# Patient Record
Sex: Female | Born: 1980 | Race: Black or African American | Hispanic: No | Marital: Single | Smoking: Never smoker
Health system: Southern US, Community
[De-identification: ages and names within clinical notes are randomized; demographics above are authoritative.]

## PROBLEM LIST (undated history)

## (undated) DIAGNOSIS — I4891 Unspecified atrial fibrillation: Secondary | ICD-10-CM

## (undated) HISTORY — PX: OTHER SURGICAL HISTORY: SHX169

---

## 2013-02-06 DIAGNOSIS — I4891 Unspecified atrial fibrillation: Secondary | ICD-10-CM | POA: Insufficient documentation

## 2013-11-09 HISTORY — PX: OTHER SURGICAL HISTORY: SHX169

## 2020-04-18 ENCOUNTER — Emergency Department (HOSPITAL_COMMUNITY)
Admission: EM | Admit: 2020-04-18 | Discharge: 2020-04-18 | Disposition: A | Discharge status: Home / Self Care | Payer: Medicaid Other | Attending: Emergency Medicine | Admitting: Emergency Medicine

## 2020-04-18 ENCOUNTER — Encounter (HOSPITAL_COMMUNITY): Payer: Self-pay | Admitting: Emergency Medicine

## 2020-04-18 ENCOUNTER — Other Ambulatory Visit: Payer: Self-pay

## 2020-04-18 DIAGNOSIS — Z5321 Procedure and treatment not carried out due to patient leaving prior to being seen by health care provider: Secondary | ICD-10-CM | POA: Diagnosis not present

## 2020-04-18 DIAGNOSIS — N200 Calculus of kidney: Secondary | ICD-10-CM | POA: Insufficient documentation

## 2020-04-18 HISTORY — DX: Unspecified atrial fibrillation: I48.91

## 2020-04-18 NOTE — ED Triage Notes (Signed)
Pt reports having numbness in right arm and leg that started appx 1 hr ago. Pt denies any other neurological symptoms. Pt has full range of motion with upper and lower extremities

## 2021-03-17 DIAGNOSIS — L02422 Furuncle of left axilla: Secondary | ICD-10-CM | POA: Insufficient documentation

## 2021-05-08 ENCOUNTER — Encounter: Payer: Self-pay | Admitting: Neurology

## 2021-05-08 ENCOUNTER — Emergency Department (HOSPITAL_COMMUNITY)
Admission: EM | Admit: 2021-05-08 | Discharge: 2021-05-08 | Disposition: A | Discharge status: Home / Self Care | Payer: Medicaid Other | Attending: Emergency Medicine | Admitting: Emergency Medicine

## 2021-05-08 ENCOUNTER — Emergency Department (HOSPITAL_COMMUNITY): Payer: Medicaid Other

## 2021-05-08 ENCOUNTER — Other Ambulatory Visit: Payer: Self-pay

## 2021-05-08 DIAGNOSIS — R200 Anesthesia of skin: Secondary | ICD-10-CM | POA: Diagnosis present

## 2021-05-08 DIAGNOSIS — R202 Paresthesia of skin: Secondary | ICD-10-CM | POA: Insufficient documentation

## 2021-05-08 LAB — URINALYSIS, ROUTINE W REFLEX MICROSCOPIC
Bilirubin Urine: NEGATIVE
Glucose, UA: NEGATIVE mg/dL
Hgb urine dipstick: NEGATIVE
Ketones, ur: NEGATIVE mg/dL
Nitrite: NEGATIVE
Protein, ur: NEGATIVE mg/dL
Specific Gravity, Urine: 1.013 (ref 1.005–1.030)
pH: 6 (ref 5.0–8.0)

## 2021-05-08 LAB — CBC
HCT: 34.9 % — ABNORMAL LOW (ref 36.0–46.0)
Hemoglobin: 10.9 g/dL — ABNORMAL LOW (ref 12.0–15.0)
MCH: 24.5 pg — ABNORMAL LOW (ref 26.0–34.0)
MCHC: 31.2 g/dL (ref 30.0–36.0)
MCV: 78.6 fL — ABNORMAL LOW (ref 80.0–100.0)
Platelets: 263 10*3/uL (ref 150–400)
RBC: 4.44 MIL/uL (ref 3.87–5.11)
RDW: 14.7 % (ref 11.5–15.5)
WBC: 5.6 10*3/uL (ref 4.0–10.5)
nRBC: 0 % (ref 0.0–0.2)

## 2021-05-08 LAB — BASIC METABOLIC PANEL
Anion gap: 7 (ref 5–15)
BUN: 10 mg/dL (ref 6–20)
CO2: 23 mmol/L (ref 22–32)
Calcium: 9.2 mg/dL (ref 8.9–10.3)
Chloride: 106 mmol/L (ref 98–111)
Creatinine, Ser: 0.57 mg/dL (ref 0.44–1.00)
GFR, Estimated: >60 mL/min (ref >60)
Glucose, Bld: 104 mg/dL — ABNORMAL HIGH (ref 70–99)
Potassium: 3.9 mmol/L (ref 3.5–5.1)
Sodium: 136 mmol/L (ref 135–145)

## 2021-05-08 LAB — I-STAT BETA HCG BLOOD, ED (MC, WL, AP ONLY): I-stat hCG, quantitative: <5 m[IU]/mL (ref <5)

## 2021-05-08 IMAGING — MR MR CERVICAL SPINE WO/W CM
35 of 38 series · 43 of 48 positions shown · IV contrast (gadavist)
Comparison: None.

CLINICAL DATA: Right leg numbness and tingling. Rule out
demyelinating disease.

EXAM:
MRI CERVICAL SPINE WITHOUT AND WITH CONTRAST
TECHNIQUE: Multiplanar and multiecho pulse sequences of the cervical spine, to
include the craniocervical junction and cervicothoracic junction,
were obtained without and with intravenous contrast.
CONTRAST:  9mL GADAVIST GADOBUTROL 1 MMOL/ML IV SOLN

[Series 5: DWI · axial · 3.0mm · 1.36mm/px · z∈[-122,+31]mm · 2 of 104 slices shown (1 of 2)]
[im 1/104]
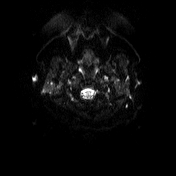
[im 104/104]
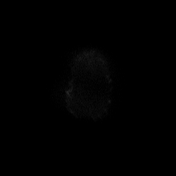

[Series 6: DWI · axial · 3.0mm · 1.36mm/px · 1 of 52 slices shown (2 of 2)]
[im 1/52]
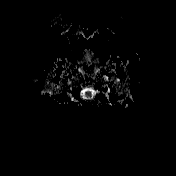

[Series 7: T1 · sagittal · 5.0mm · 0.75mm/px · 1 of 22 slices shown (1 of 9)]
[im 1/22]
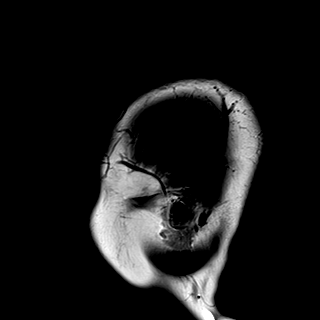

[Series 8: T2 · axial · 5.0mm · 0.62mm/px · 1 of 26 slices shown (1 of 8)]
[im 1/26]
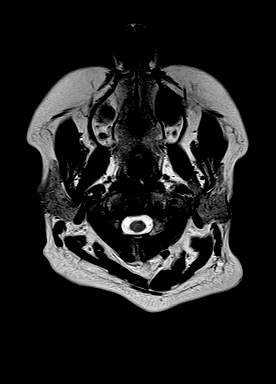

[Series 9: swi_images · axial · 3.0mm · 0.75mm/px · 1 of 56 slices shown]
[im 1/56]
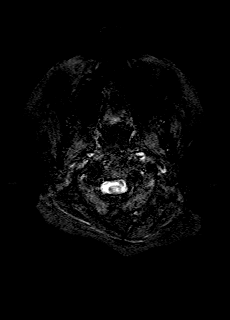

[Series 11: FLAIR · axial · 3.0mm · 0.75mm/px · 1 of 52 slices shown]
[im 1/52]
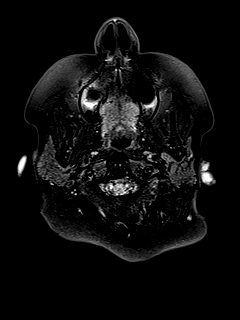

[Series 12: T1 · axial · 1.0mm · 0.94mm/px · z∈[-118,+41]mm · 4 of 160 slices shown (2 of 9)]
[im 1/160]
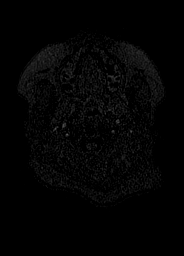
[im 54/160]
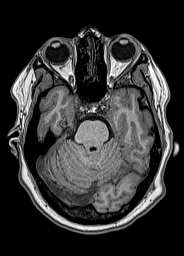
[im 107/160]
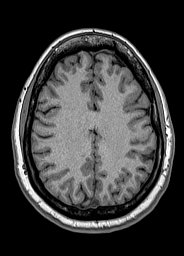
[im 160/160]
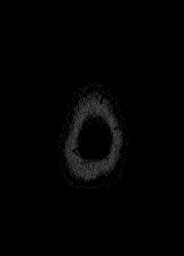

[Series 13: cor dwi_tracew · coronal · 5.0mm · 1.53mm/px · 1 of 56 slices shown]
[im 1/56]
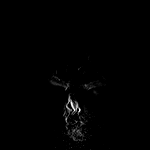

[Series 14: cor dwi_adc · coronal · 5.0mm · 1.53mm/px · 1 of 28 slices shown]
[im 1/28]
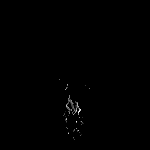

[Series 16: T2 · coronal · 5.0mm · 0.57mm/px · 1 of 35 slices shown (2 of 8)]
[im 1/35]
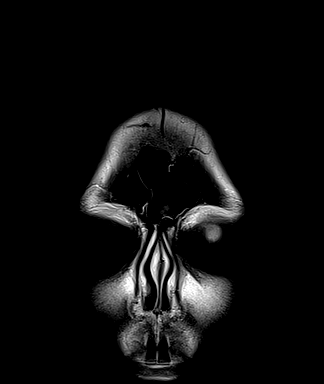

[Series 21: T1 · sagittal · 3.0mm · 0.69mm/px · 1 of 15 slices shown (3 of 9)]
[im 1/15]
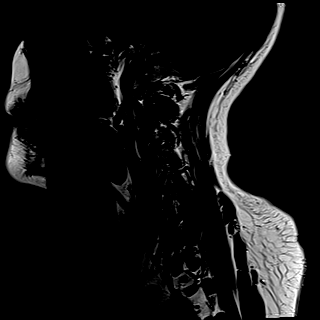

[Series 22: T2 · sagittal · 3.0mm · 0.69mm/px · 1 of 15 slices shown (3 of 8)]
[im 1/15]
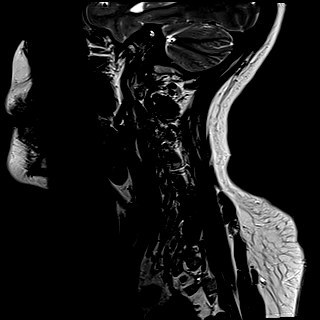

[Series 23: STIR · sagittal · 3.0mm · 0.86mm/px · 1 of 15 slices shown (1 of 3)]
[im 1/15]
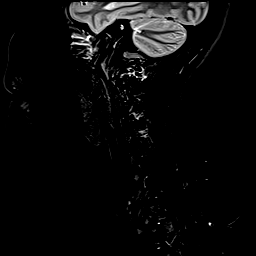

[Series 24: T2 · axial · 3.0mm · 0.70mm/px · 1 of 30 slices shown (4 of 8)]
[im 1/30]
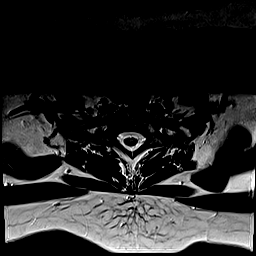

[Series 26: T1 · axial · 3.0mm · 0.35mm/px · 1 of 30 slices shown (4 of 9)]
[im 1/30]
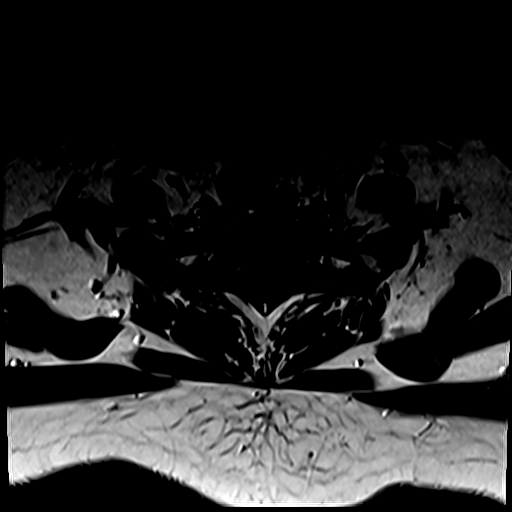

[Series 42: T1 · sagittal · 4.0mm · 1.72mm/px · 1 of 5 slices shown (5 of 9)]
[im 1/5]
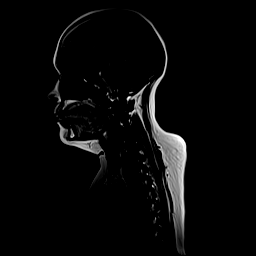

[Series 43: STIR · sagittal · 3.0mm · 1.03mm/px · 1 of 15 slices shown (2 of 3)]
[im 1/15]
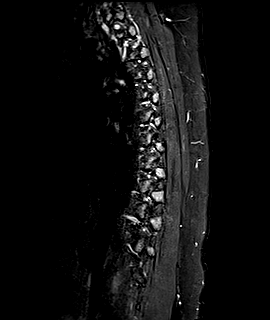

[Series 44: T1 · sagittal · 3.0mm · 1.00mm/px · 1 of 15 slices shown (6 of 9)]
[im 1/15]
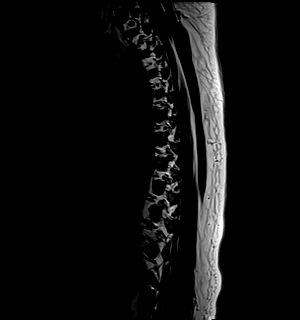

[Series 45: T2 · sagittal · 3.0mm · 0.86mm/px · 1 of 15 slices shown (5 of 8)]
[im 1/15]
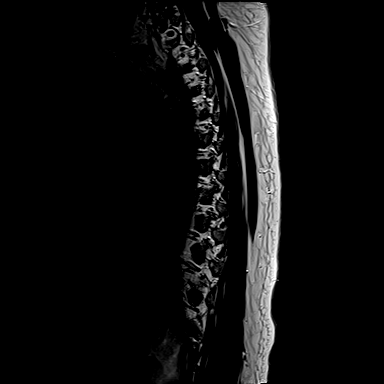

[Series 46: T2 · axial · 4.0mm · 0.78mm/px · 1 of 39 slices shown (6 of 8)]
[im 1/39]
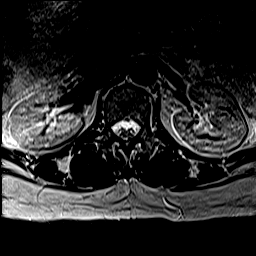

[Series 48: T1 · axial · 4.0mm · 0.39mm/px · 1 of 39 slices shown (7 of 9)]
[im 1/39]
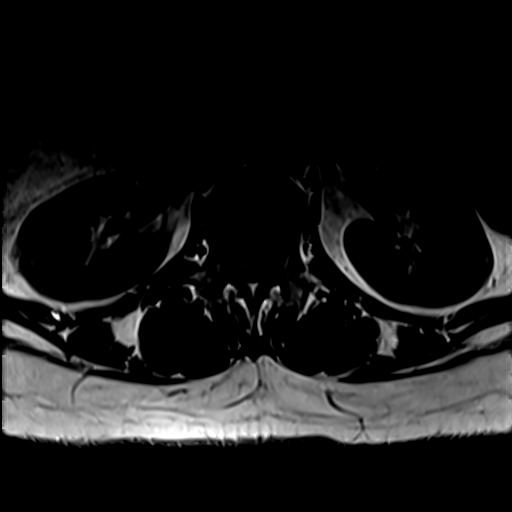

[Series 54: T2 · sagittal · 4.0mm · 0.81mm/px · 1 of 14 slices shown (7 of 8)]
[im 1/14]
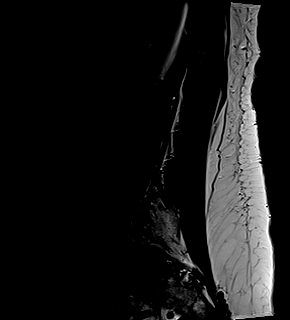

[Series 55: T1 · sagittal · 4.0mm · 0.81mm/px · 1 of 14 slices shown (8 of 9)]
[im 1/14]
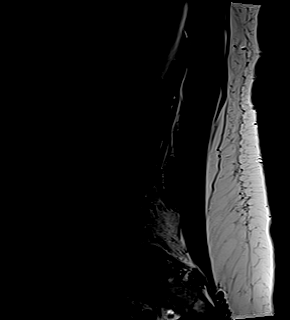

[Series 56: STIR · sagittal · 4.0mm · 0.51mm/px · 1 of 14 slices shown (3 of 3)]
[im 1/14]
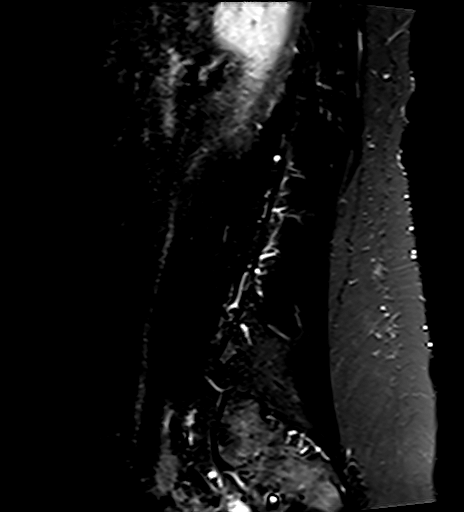

[Series 57: T2 · axial · 4.0mm · 0.62mm/px · 1 of 39 slices shown (8 of 8)]
[im 1/39]
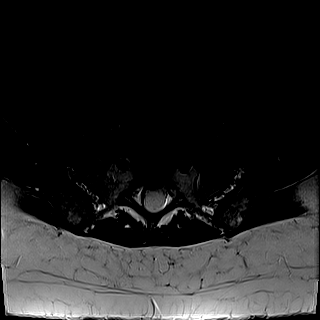

[Series 58: T1 · axial · 4.0mm · 0.39mm/px · 1 of 39 slices shown (9 of 9)]
[im 1/39]
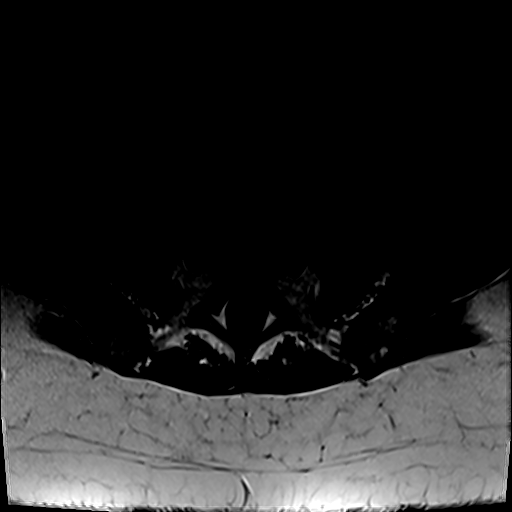

[Series 59: T1 fat-sat post-contrast · sagittal · 4.0mm · 0.81mm/px · 1 of 14 slices shown (1 of 3)]
[im 1/14]
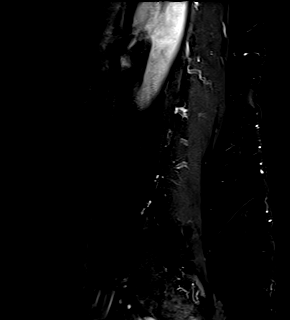

[Series 60: T1 post-contrast · axial · 4.0mm · 0.43mm/px · 1 of 39 slices shown (1 of 6)]
[im 1/39]
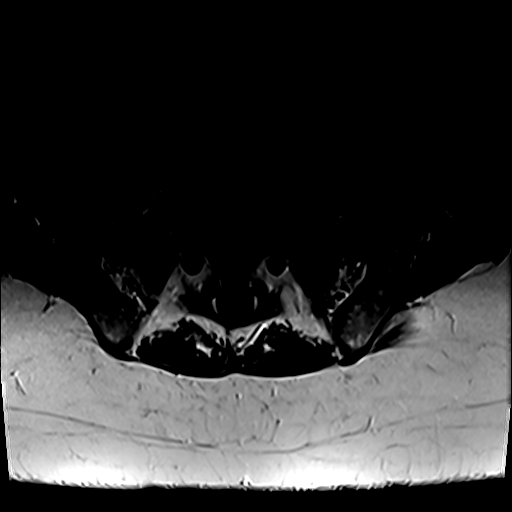

[Series 61: T1 fat-sat post-contrast · sagittal · 3.0mm · 1.03mm/px · 1 of 15 slices shown (2 of 3)]
[im 1/15]
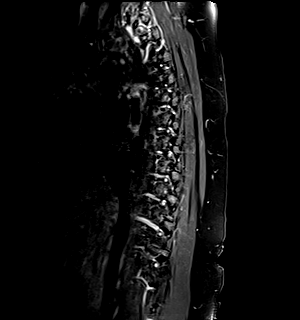

[Series 62: T1 post-contrast · axial · 4.0mm · 0.39mm/px · 1 of 39 slices shown (2 of 6)]
[im 1/39]
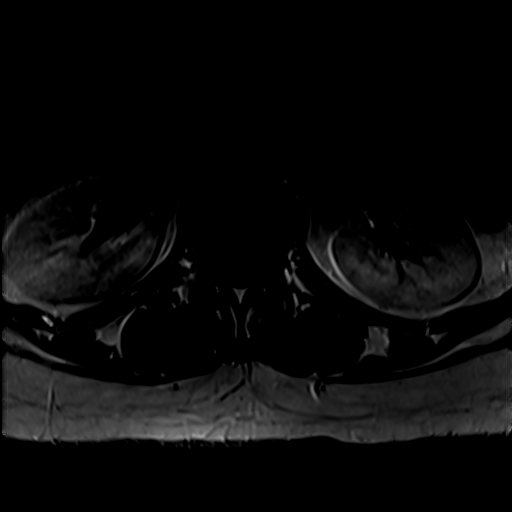

[Series 63: T1 fat-sat post-contrast · sagittal · 3.0mm · 0.69mm/px · 1 of 15 slices shown (3 of 3)]
[im 1/15]
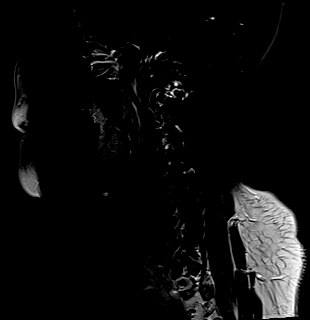

[Series 64: T1 post-contrast · axial · 3.0mm · 0.35mm/px · 1 of 30 slices shown (3 of 6)]
[im 1/30]
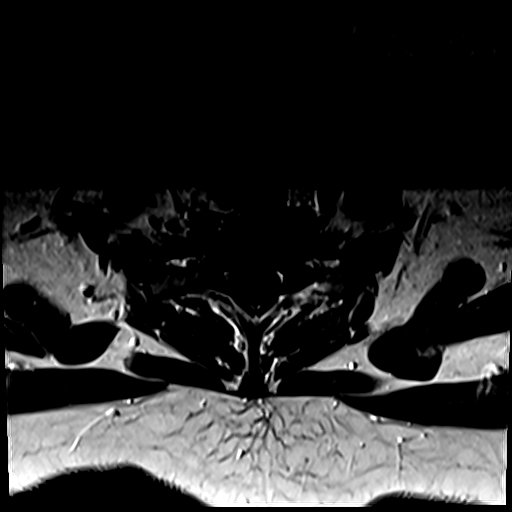

[Series 69: T1 post-contrast · axial · 1.0mm · 0.94mm/px · z∈[-105,+38]mm · 5 of 144 slices shown (4 of 6)]
[im 1/144]
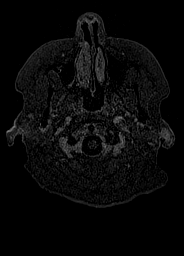
[im 36/144]
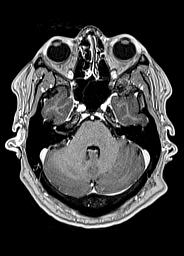
[im 72/144]
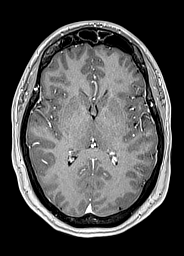
[im 108/144]
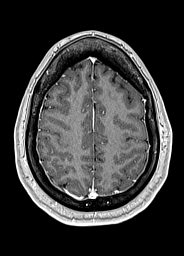
[im 144/144]
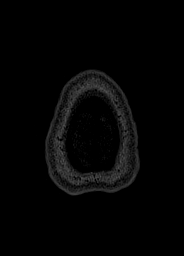

[Series 70: T1 post-contrast · coronal · 5.0mm · 0.43mm/px · 1 of 26 slices shown (5 of 6)]
[im 1/26]
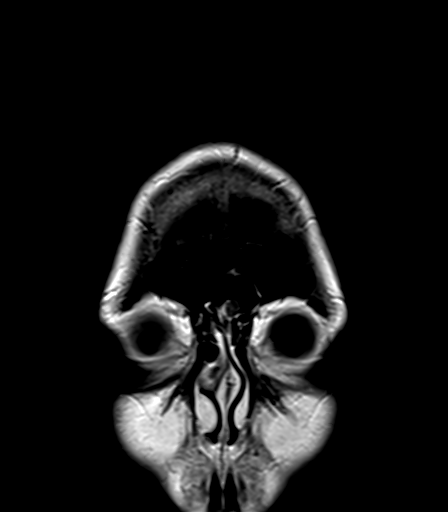

[Series 71: T1 post-contrast · sagittal · 5.0mm · 0.75mm/px · 1 of 24 slices shown (6 of 6)]
[im 1/24]
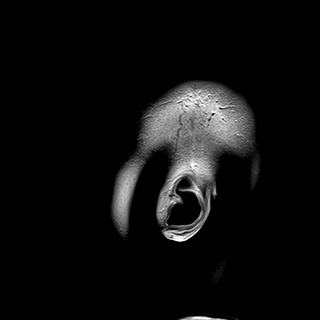

[43 of 48 positions shown; findings below may reference images not displayed]

FINDINGS: Alignment: Normal alignment.  Mild cervical kyphosis

Vertebrae: Negative for fracture or mass

Cord: No focal cord lesion identified. No abnormal enhancement of
the cord.

Posterior Fossa, vertebral arteries, paraspinal tissues: Negative

Disc levels:

C2-3: Negative

C3-4: Negative

C4-5: Small central disc protrusion with cord flattening and mild
spinal stenosis. Neural foramina patent bilaterally

C5-6: Disc degeneration and mild diffuse uncinate spurring. Cord
flattening with mild spinal stenosis and mild foraminal narrowing
bilaterally

C6-7: Disc degeneration with mild diffuse uncinate spurring. No
significant spinal or foraminal stenosis

C7-T1: Negative
IMPRESSION: No focal cord lesion identified

Cervical spondylosis. Mild spinal stenosis C5-6 with mild foraminal
narrowing bilaterally.

## 2021-05-08 IMAGING — MR MR HEAD WO/W CM
29 of 38 series · 39 of 48 positions shown · IV contrast (gadavist)
Comparison: None.

CLINICAL DATA: Right leg numbness and tingling. Rule out
demyelinating disease.

EXAM:
MRI HEAD WITHOUT AND WITH CONTRAST
TECHNIQUE: Multiplanar, multiecho pulse sequences of the brain and surrounding
structures were obtained without and with intravenous contrast.
CONTRAST:  9mL GADAVIST GADOBUTROL 1 MMOL/ML IV SOLN

[Series 5: DWI · axial · 3.0mm · 1.36mm/px · z∈[-122,+31]mm · 2 of 104 slices shown (1 of 2)]
[im 1/104]
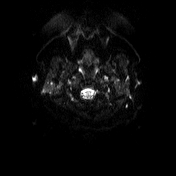
[im 104/104]
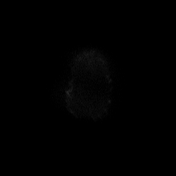

[Series 6: DWI · axial · 3.0mm · 1.36mm/px · 1 of 52 slices shown (2 of 2)]
[im 1/52]
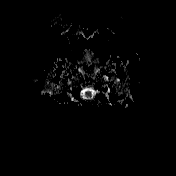

[Series 7: T1 · sagittal · 5.0mm · 0.75mm/px · 1 of 22 slices shown (1 of 9)]
[im 1/22]
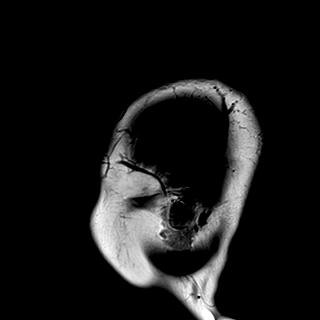

[Series 8: T2 · axial · 5.0mm · 0.62mm/px · 1 of 26 slices shown (1 of 8)]
[im 1/26]
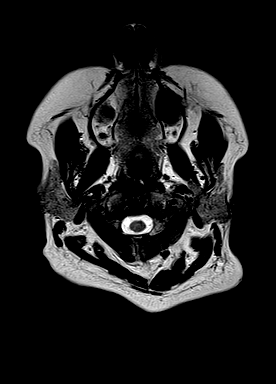

[Series 11: FLAIR · axial · 3.0mm · 0.75mm/px · 1 of 52 slices shown (1 of 2)]
[im 1/52]
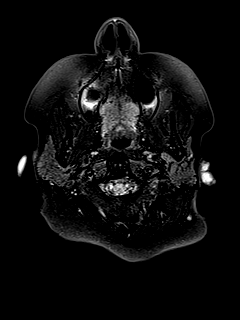

[Series 12: T1 · axial · 1.0mm · 0.94mm/px · z∈[-118,+41]mm · 4 of 160 slices shown (2 of 9)]
[im 1/160]
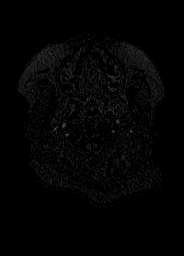
[im 54/160]
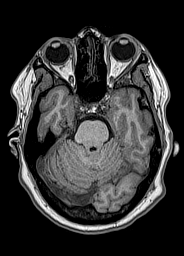
[im 107/160]
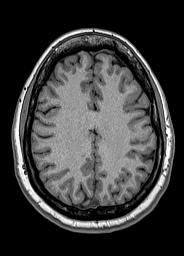
[im 160/160]
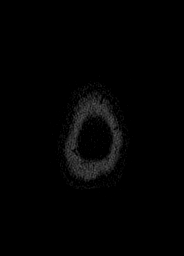

[Series 15: FLAIR · sagittal · 1.1mm · 0.50mm/px · 3 of 120 slices shown (2 of 2)]
[im 1/120]
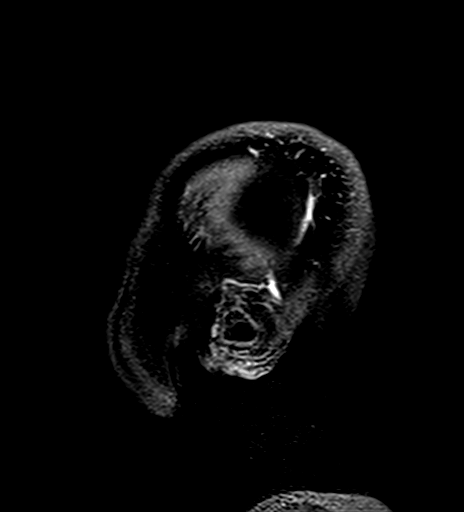
[im 60/120]
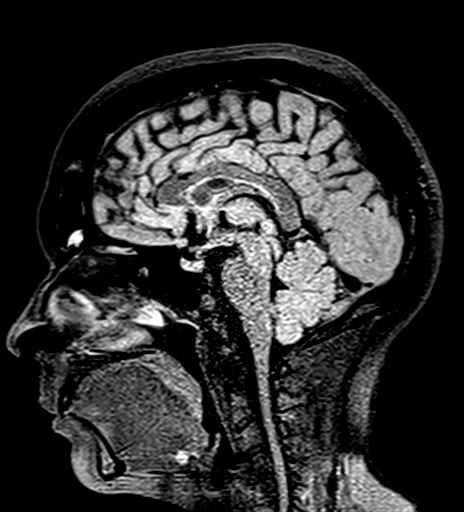
[im 120/120]
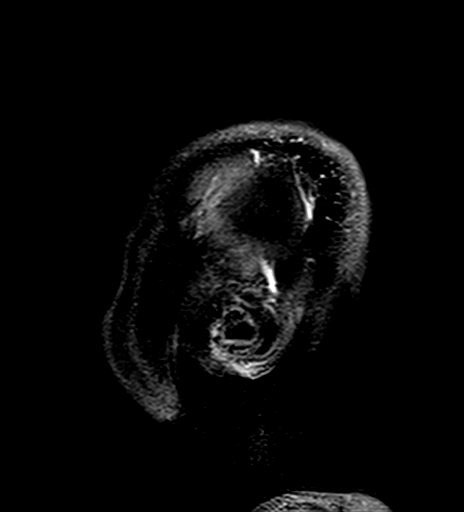

[Series 16: T2 · coronal · 5.0mm · 0.57mm/px · 1 of 35 slices shown (2 of 8)]
[im 1/35]
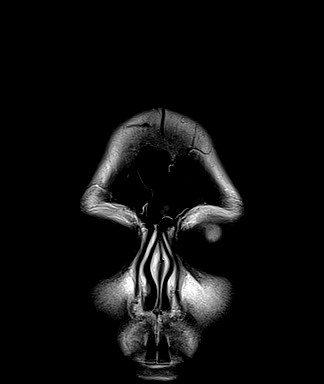

[Series 21: T1 · sagittal · 3.0mm · 0.69mm/px · 1 of 15 slices shown (3 of 9)]
[im 1/15]
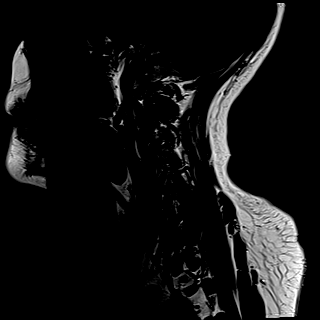

[Series 22: T2 · sagittal · 3.0mm · 0.69mm/px · 1 of 15 slices shown (3 of 8)]
[im 1/15]
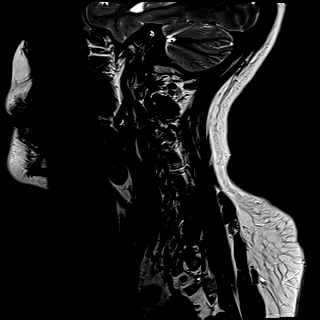

[Series 24: T2 · axial · 3.0mm · 0.70mm/px · 1 of 30 slices shown (4 of 8)]
[im 1/30]
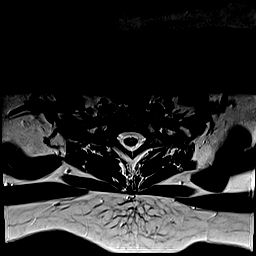

[Series 25: GRE · axial · 3.0mm · 0.35mm/px · 1 of 30 slices shown]
[im 1/30]
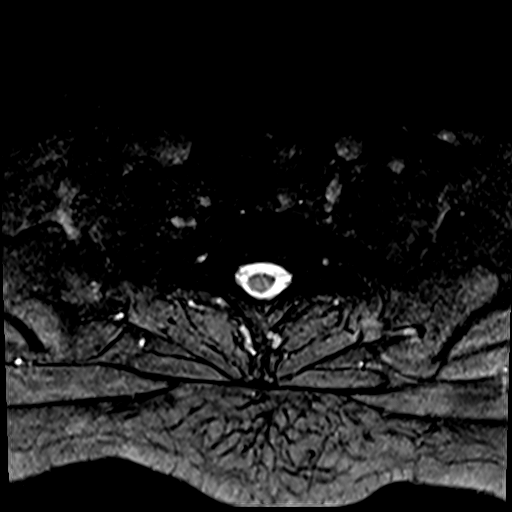

[Series 26: T1 · axial · 3.0mm · 0.35mm/px · 1 of 30 slices shown (4 of 9)]
[im 1/30]
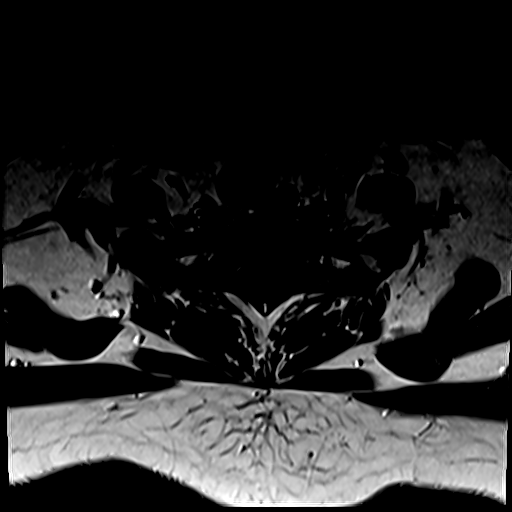

[Series 42: T1 · sagittal · 4.0mm · 1.72mm/px · 1 of 5 slices shown (5 of 9)]
[im 1/5]
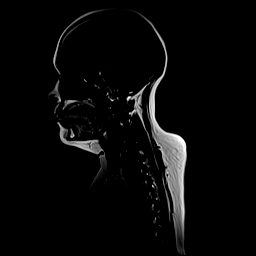

[Series 44: T1 · sagittal · 3.0mm · 1.00mm/px · 1 of 15 slices shown (6 of 9)]
[im 1/15]
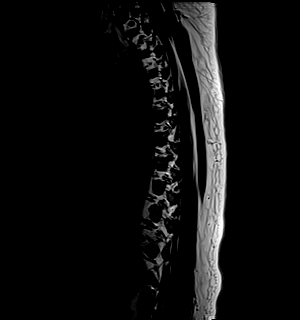

[Series 45: T2 · sagittal · 3.0mm · 0.86mm/px · 1 of 15 slices shown (5 of 8)]
[im 1/15]
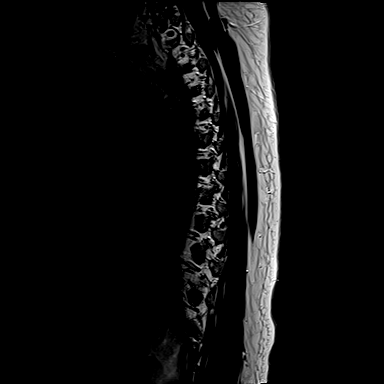

[Series 46: T2 · axial · 4.0mm · 0.78mm/px · 1 of 39 slices shown (6 of 8)]
[im 1/39]
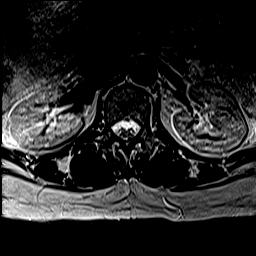

[Series 48: T1 · axial · 4.0mm · 0.39mm/px · 1 of 39 slices shown (7 of 9)]
[im 1/39]
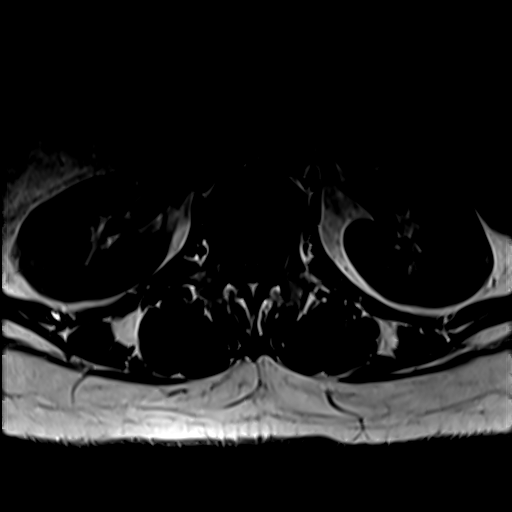

[Series 54: T2 · sagittal · 4.0mm · 0.81mm/px · 1 of 14 slices shown (7 of 8)]
[im 1/14]
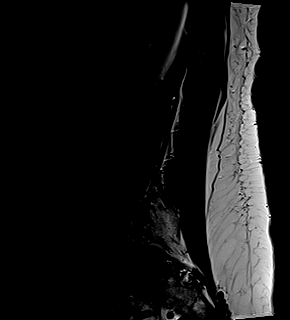

[Series 55: T1 · sagittal · 4.0mm · 0.81mm/px · 1 of 14 slices shown (8 of 9)]
[im 1/14]
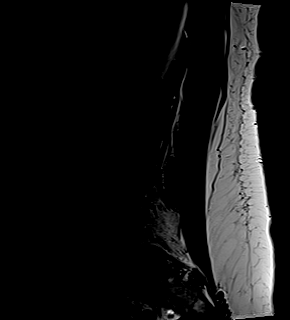

[Series 57: T2 · axial · 4.0mm · 0.62mm/px · 1 of 39 slices shown (8 of 8)]
[im 1/39]
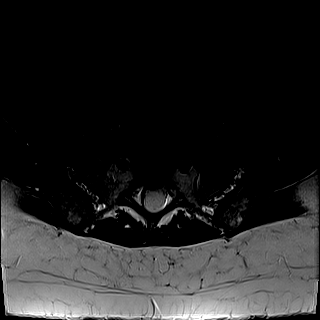

[Series 58: T1 · axial · 4.0mm · 0.39mm/px · 1 of 39 slices shown (9 of 9)]
[im 1/39]
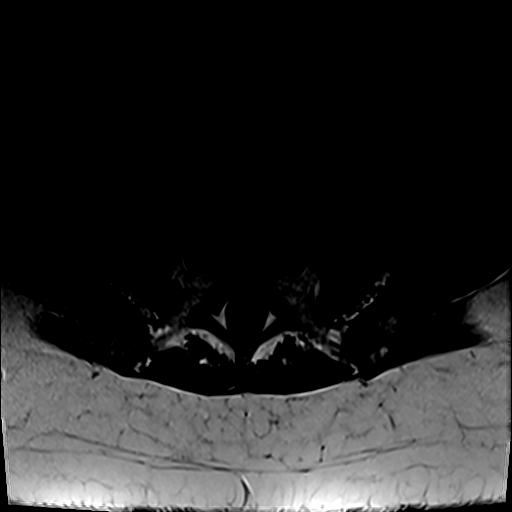

[Series 59: T1 fat-sat post-contrast · sagittal · 4.0mm · 0.81mm/px · 1 of 14 slices shown]
[im 1/14]
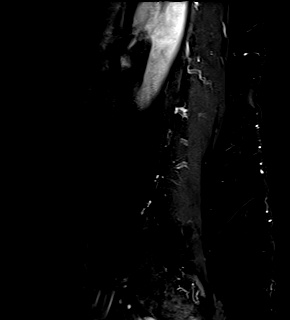

[Series 60: T1 post-contrast · axial · 4.0mm · 0.43mm/px · 1 of 39 slices shown (1 of 6)]
[im 1/39]
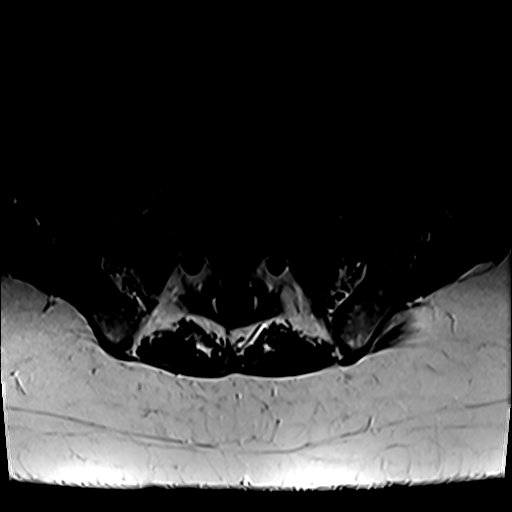

[Series 62: T1 post-contrast · axial · 4.0mm · 0.39mm/px · 1 of 39 slices shown (2 of 6)]
[im 1/39]
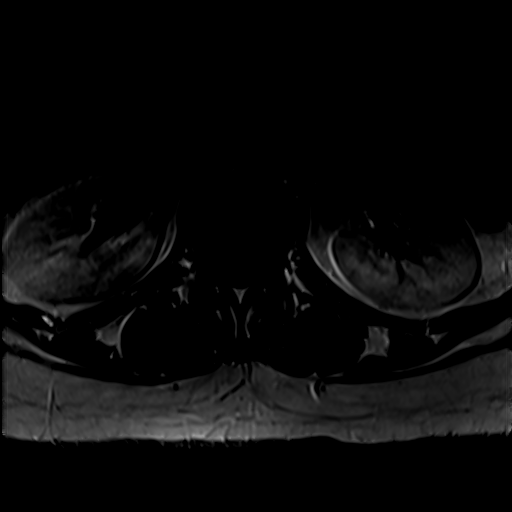

[Series 64: T1 post-contrast · axial · 3.0mm · 0.35mm/px · 1 of 30 slices shown (3 of 6)]
[im 1/30]
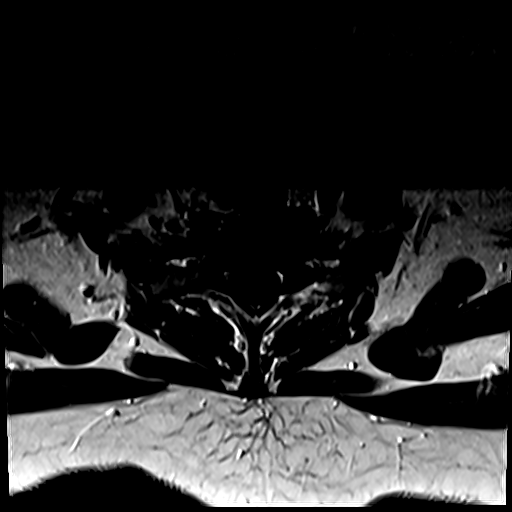

[Series 69: T1 post-contrast · axial · 1.0mm · 0.94mm/px · z∈[-105,+38]mm · 5 of 144 slices shown (4 of 6)]
[im 1/144]
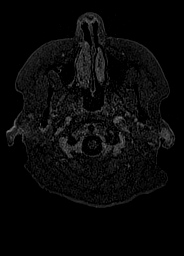
[im 36/144]
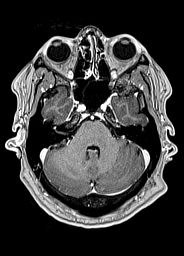
[im 72/144]
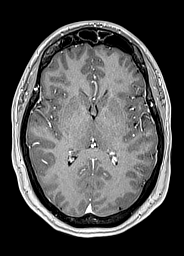
[im 108/144]
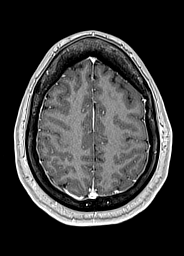
[im 144/144]
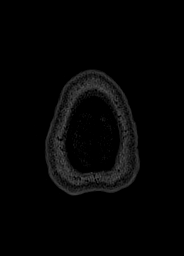

[Series 70: T1 post-contrast · coronal · 5.0mm · 0.43mm/px · 1 of 26 slices shown (5 of 6)]
[im 1/26]
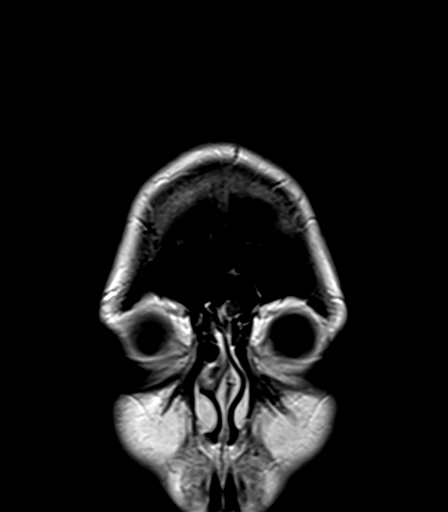

[Series 71: T1 post-contrast · sagittal · 5.0mm · 0.75mm/px · 1 of 24 slices shown (6 of 6)]
[im 1/24]
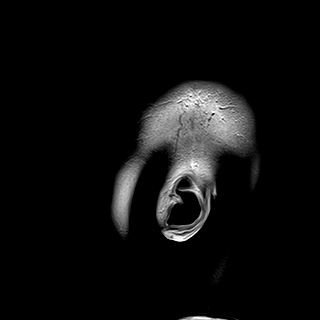

[39 of 48 positions shown; findings below may reference images not displayed]

FINDINGS: Brain: Ventricle size and cerebral volume normal. Negative for acute
infarct, hemorrhage, mass

Mild white matter changes. Mild periventricular white matter
hyperintensity in the frontal white matter right greater than left.
Few small subcortical white matter hyperintensities in the frontal
lobes bilaterally. No lesions show enhancement or restricted
diffusion.

Vascular: Normal arterial flow voids

Skull and upper cervical spine: Negative

Sinuses/Orbits: Negative

Other: None
IMPRESSION: Mild white matter changes, nonspecific appearance. Findings are not
strongly suggestive of demyelinating disease. Differential includes
demyelinating disease, chronic microvascular ischemia and other
chronic white matter inflammatory disorders.

## 2021-05-08 IMAGING — MR MR LUMBAR SPINE WO/W CM
35 of 38 series · 43 of 48 positions shown · IV contrast (gadavist)
Comparison: None.

CLINICAL DATA: Numbness and tingling right leg. Rule out
demyelinating disease.

EXAM:
MRI LUMBAR SPINE WITHOUT AND WITH CONTRAST
TECHNIQUE: Multiplanar and multiecho pulse sequences of the lumbar spine were
obtained without and with intravenous contrast.
CONTRAST:  9mL GADAVIST GADOBUTROL 1 MMOL/ML IV SOLN

[Series 5: DWI · axial · 3.0mm · 1.36mm/px · z∈[-122,+31]mm · 2 of 104 slices shown (1 of 2)]
[im 1/104]
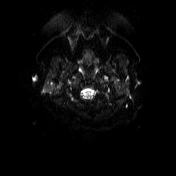
[im 104/104]
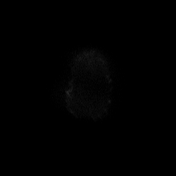

[Series 6: DWI · axial · 3.0mm · 1.36mm/px · 1 of 52 slices shown (2 of 2)]
[im 1/52]
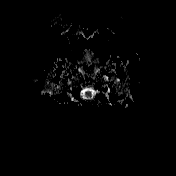

[Series 7: T1 · sagittal · 5.0mm · 0.75mm/px · 1 of 22 slices shown (1 of 9)]
[im 1/22]
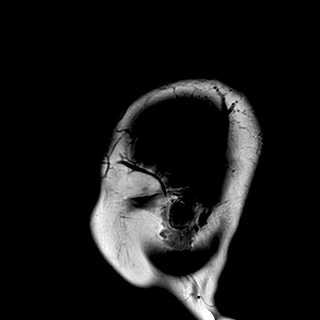

[Series 8: T2 · axial · 5.0mm · 0.62mm/px · 1 of 26 slices shown (1 of 8)]
[im 1/26]
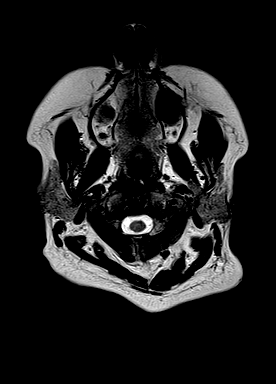

[Series 9: swi_images · axial · 3.0mm · 0.75mm/px · 1 of 56 slices shown]
[im 1/56]
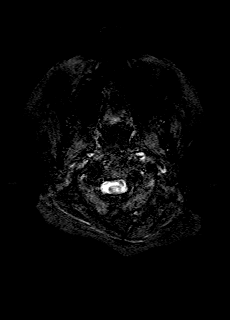

[Series 11: FLAIR · axial · 3.0mm · 0.75mm/px · 1 of 52 slices shown]
[im 1/52]
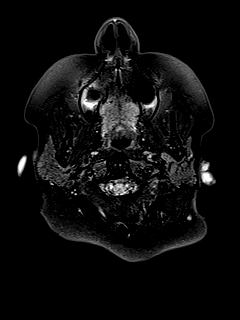

[Series 12: T1 · axial · 1.0mm · 0.94mm/px · z∈[-118,+41]mm · 4 of 160 slices shown (2 of 9)]
[im 1/160]
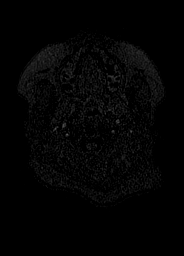
[im 54/160]
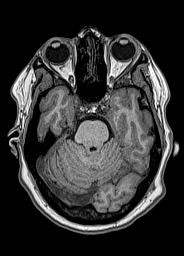
[im 107/160]
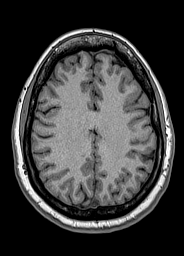
[im 160/160]
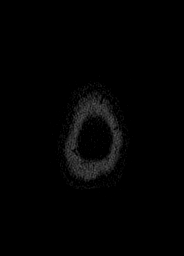

[Series 13: cor dwi_tracew · coronal · 5.0mm · 1.53mm/px · 1 of 56 slices shown]
[im 1/56]
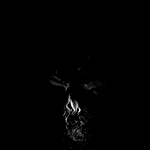

[Series 14: cor dwi_adc · coronal · 5.0mm · 1.53mm/px · 1 of 28 slices shown]
[im 1/28]
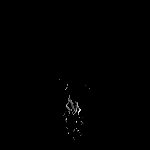

[Series 16: T2 · coronal · 5.0mm · 0.57mm/px · 1 of 35 slices shown (2 of 8)]
[im 1/35]
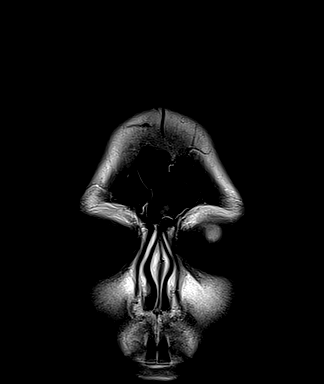

[Series 21: T1 · sagittal · 3.0mm · 0.69mm/px · 1 of 15 slices shown (3 of 9)]
[im 1/15]
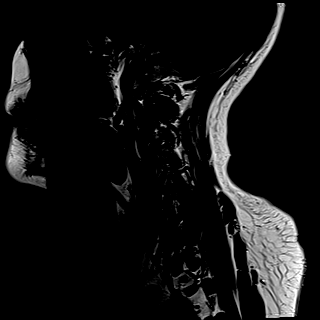

[Series 22: T2 · sagittal · 3.0mm · 0.69mm/px · 1 of 15 slices shown (3 of 8)]
[im 1/15]
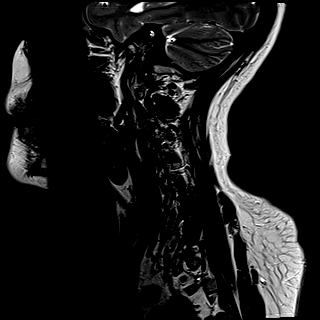

[Series 23: STIR · sagittal · 3.0mm · 0.86mm/px · 1 of 15 slices shown (1 of 3)]
[im 1/15]
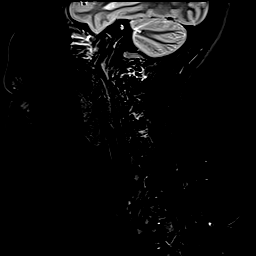

[Series 24: T2 · axial · 3.0mm · 0.70mm/px · 1 of 30 slices shown (4 of 8)]
[im 1/30]
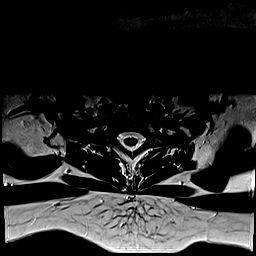

[Series 26: T1 · axial · 3.0mm · 0.35mm/px · 1 of 30 slices shown (4 of 9)]
[im 1/30]
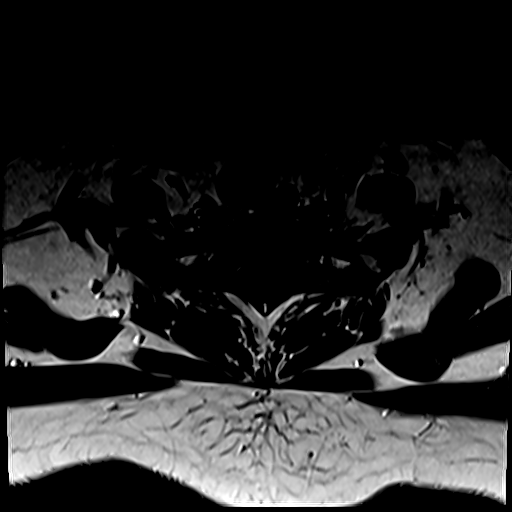

[Series 42: T1 · sagittal · 4.0mm · 1.72mm/px · 1 of 5 slices shown (5 of 9)]
[im 1/5]
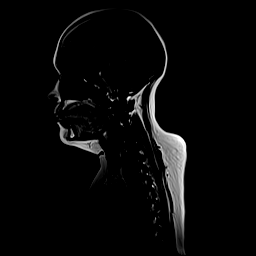

[Series 43: STIR · sagittal · 3.0mm · 1.03mm/px · 1 of 15 slices shown (2 of 3)]
[im 1/15]
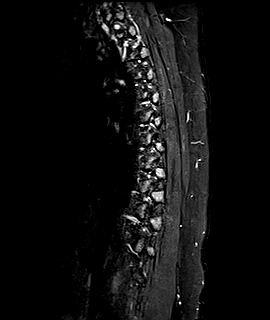

[Series 44: T1 · sagittal · 3.0mm · 1.00mm/px · 1 of 15 slices shown (6 of 9)]
[im 1/15]
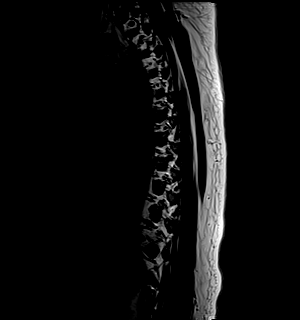

[Series 45: T2 · sagittal · 3.0mm · 0.86mm/px · 1 of 15 slices shown (5 of 8)]
[im 1/15]
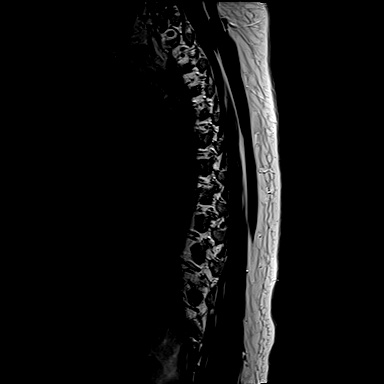

[Series 46: T2 · axial · 4.0mm · 0.78mm/px · 1 of 39 slices shown (6 of 8)]
[im 1/39]
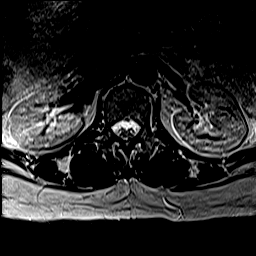

[Series 48: T1 · axial · 4.0mm · 0.39mm/px · 1 of 39 slices shown (7 of 9)]
[im 1/39]
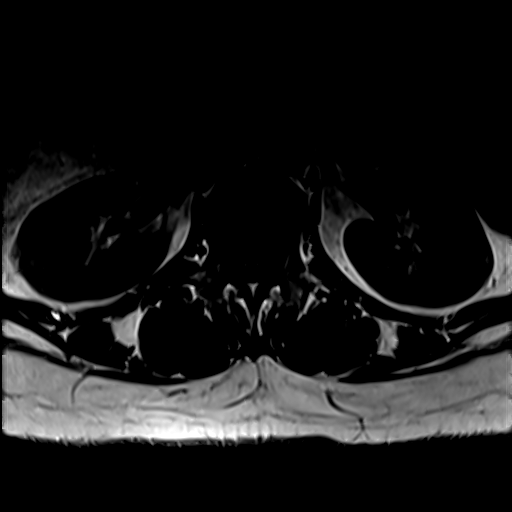

[Series 54: T2 · sagittal · 4.0mm · 0.81mm/px · 1 of 14 slices shown (7 of 8)]
[im 1/14]
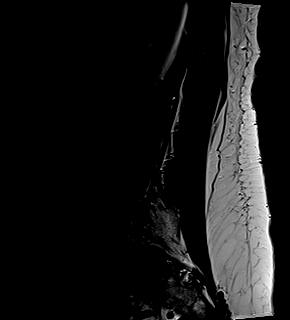

[Series 55: T1 · sagittal · 4.0mm · 0.81mm/px · 1 of 14 slices shown (8 of 9)]
[im 1/14]
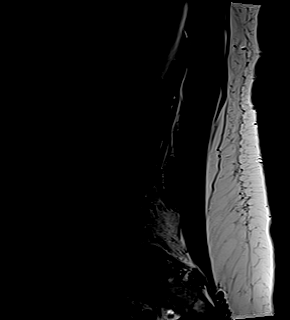

[Series 56: STIR · sagittal · 4.0mm · 0.51mm/px · 1 of 14 slices shown (3 of 3)]
[im 1/14]
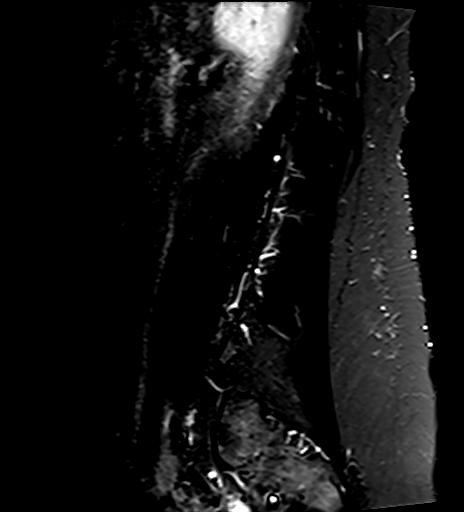

[Series 57: T2 · axial · 4.0mm · 0.62mm/px · 1 of 39 slices shown (8 of 8)]
[im 1/39]
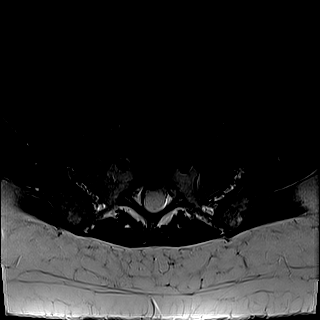

[Series 58: T1 · axial · 4.0mm · 0.39mm/px · 1 of 39 slices shown (9 of 9)]
[im 1/39]
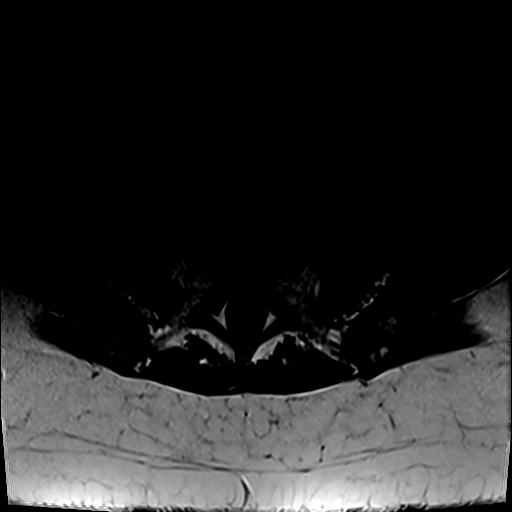

[Series 59: T1 fat-sat post-contrast · sagittal · 4.0mm · 0.81mm/px · 1 of 14 slices shown (1 of 3)]
[im 1/14]
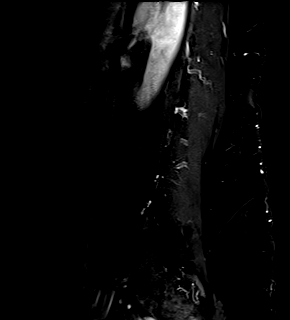

[Series 60: T1 post-contrast · axial · 4.0mm · 0.43mm/px · 1 of 39 slices shown (1 of 6)]
[im 1/39]
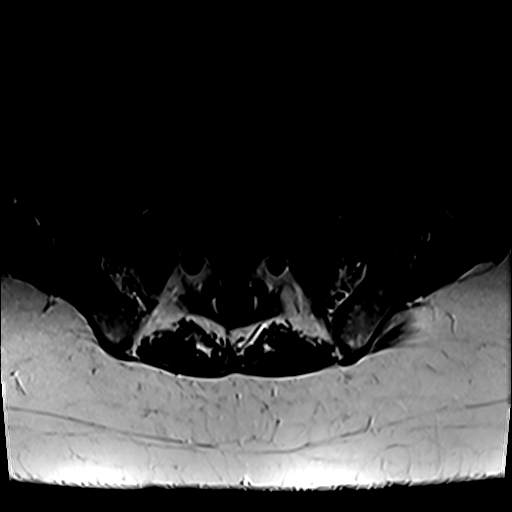

[Series 61: T1 fat-sat post-contrast · sagittal · 3.0mm · 1.03mm/px · 1 of 15 slices shown (2 of 3)]
[im 1/15]
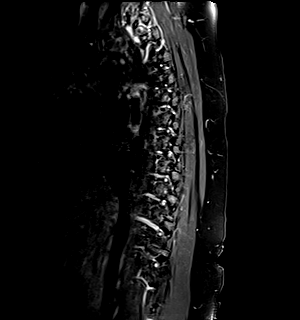

[Series 62: T1 post-contrast · axial · 4.0mm · 0.39mm/px · 1 of 39 slices shown (2 of 6)]
[im 1/39]
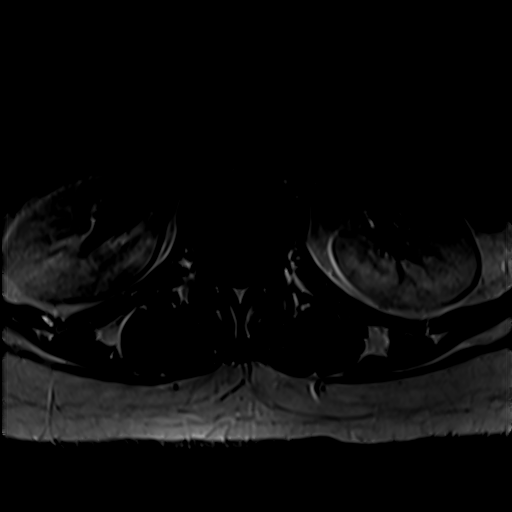

[Series 63: T1 fat-sat post-contrast · sagittal · 3.0mm · 0.69mm/px · 1 of 15 slices shown (3 of 3)]
[im 1/15]
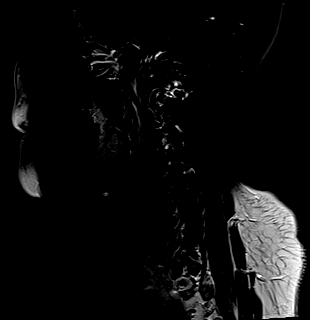

[Series 64: T1 post-contrast · axial · 3.0mm · 0.35mm/px · 1 of 30 slices shown (3 of 6)]
[im 1/30]
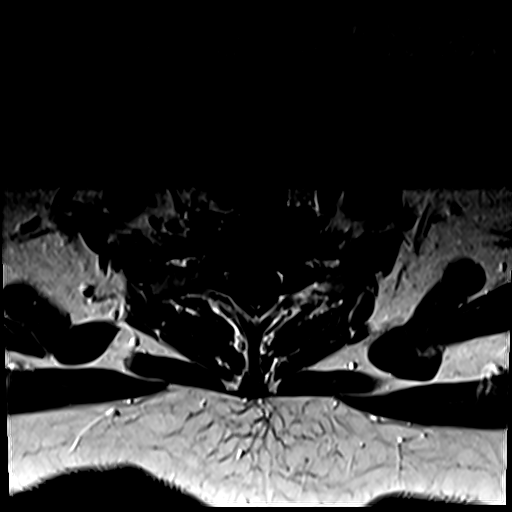

[Series 69: T1 post-contrast · axial · 1.0mm · 0.94mm/px · z∈[-105,+38]mm · 5 of 144 slices shown (4 of 6)]
[im 1/144]
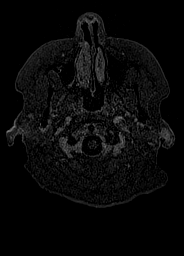
[im 36/144]
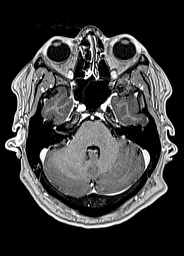
[im 72/144]
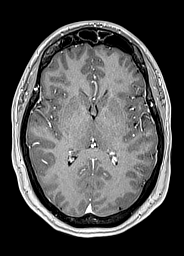
[im 108/144]
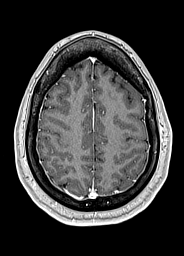
[im 144/144]
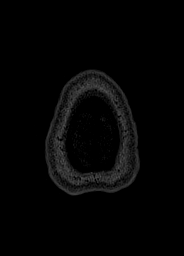

[Series 70: T1 post-contrast · coronal · 5.0mm · 0.43mm/px · 1 of 26 slices shown (5 of 6)]
[im 1/26]
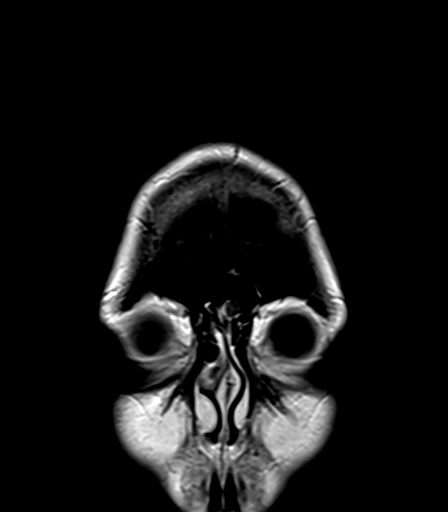

[Series 71: T1 post-contrast · sagittal · 5.0mm · 0.75mm/px · 1 of 24 slices shown (6 of 6)]
[im 1/24]
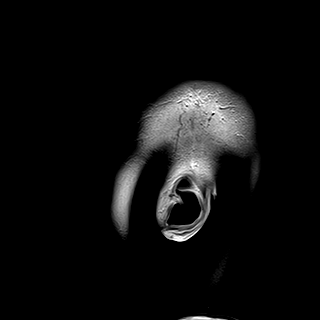

[43 of 48 positions shown; findings below may reference images not displayed]

FINDINGS: Segmentation:  Normal

Alignment:  Normal

Vertebrae: Negative for fracture or mass. Normal appearing bone
marrow.

Conus medullaris and cauda equina: Conus extends to the L1-2 level.
Conus and cauda equina appear normal.

Paraspinal and other soft tissues: Negative for paraspinous mass or
adenopathy.

Disc levels:

L1-2: Negative

L2-3: Negative

L3-4: Mild disc bulging and mild facet degeneration. Negative for
disc protrusion or stenosis.

L4-5: Mild disc bulging and mild facet degeneration. Negative for
disc protrusion or stenosis. Small annular tear lateral to the
foramen on the left without significant disc protrusion

L5-S1: Negative
IMPRESSION: Mild lumbar degenerative change.  Negative for neural impingement

Negative conus medullaris.

## 2021-05-08 IMAGING — MR MR THORACIC SPINE WO/W CM
36 of 38 series · 44 of 48 positions shown · IV contrast (gadavist)
Comparison: None.

CLINICAL DATA: Numbness and tingling right leg. Rule out
demyelinating disease.

EXAM:
MRI THORACIC WITHOUT AND WITH CONTRAST
TECHNIQUE: Multiplanar and multiecho pulse sequences of the thoracic spine were
obtained without and with intravenous contrast.
CONTRAST:  9mL GADAVIST GADOBUTROL 1 MMOL/ML IV SOLN

[Series 5: DWI · axial · 3.0mm · 1.36mm/px · z∈[-122,+31]mm · 2 of 104 slices shown (1 of 2)]
[im 1/104]
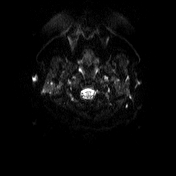
[im 104/104]
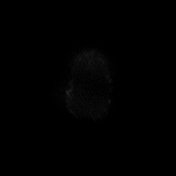

[Series 6: DWI · axial · 3.0mm · 1.36mm/px · 1 of 52 slices shown (2 of 2)]
[im 1/52]
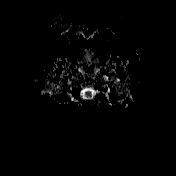

[Series 7: T1 · sagittal · 5.0mm · 0.75mm/px · 1 of 22 slices shown (1 of 9)]
[im 1/22]
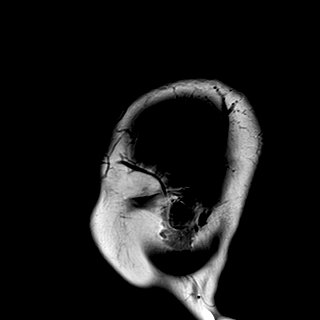

[Series 8: T2 · axial · 5.0mm · 0.62mm/px · 1 of 26 slices shown (1 of 8)]
[im 1/26]
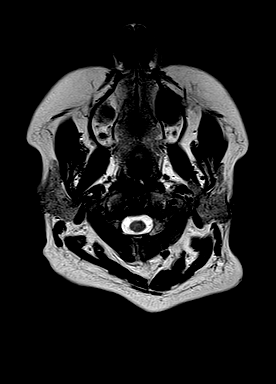

[Series 9: swi_images · axial · 3.0mm · 0.75mm/px · 1 of 56 slices shown]
[im 1/56]
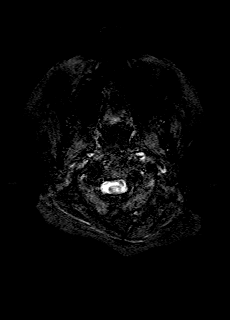

[Series 11: FLAIR · axial · 3.0mm · 0.75mm/px · 1 of 52 slices shown (1 of 2)]
[im 1/52]
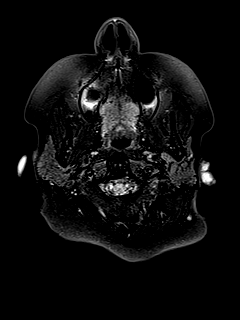

[Series 12: T1 · axial · 1.0mm · 0.94mm/px · z∈[-118,+41]mm · 4 of 160 slices shown (2 of 9)]
[im 1/160]
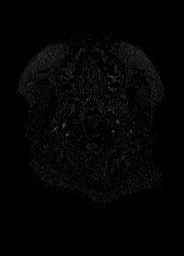
[im 54/160]
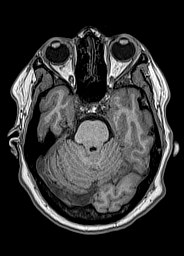
[im 107/160]
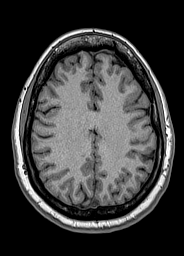
[im 160/160]
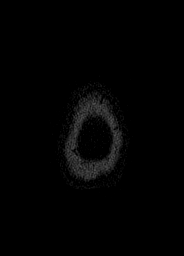

[Series 13: cor dwi_tracew · coronal · 5.0mm · 1.53mm/px · 1 of 56 slices shown]
[im 1/56]
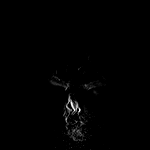

[Series 14: cor dwi_adc · coronal · 5.0mm · 1.53mm/px · 1 of 28 slices shown]
[im 1/28]
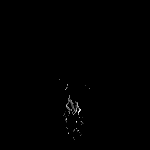

[Series 15: FLAIR · sagittal · 1.1mm · 0.50mm/px · 1 of 120 slices shown (2 of 2)]
[im 1/120]
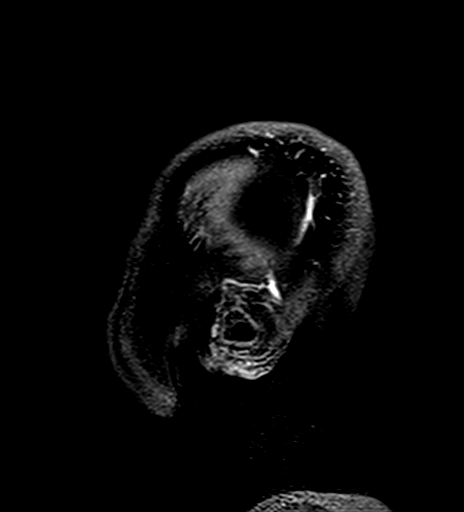

[Series 16: T2 · coronal · 5.0mm · 0.57mm/px · 1 of 35 slices shown (2 of 8)]
[im 1/35]
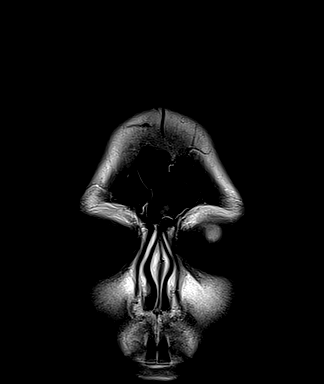

[Series 21: T1 · sagittal · 3.0mm · 0.69mm/px · 1 of 15 slices shown (3 of 9)]
[im 1/15]
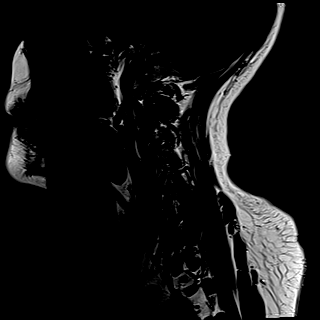

[Series 22: T2 · sagittal · 3.0mm · 0.69mm/px · 1 of 15 slices shown (3 of 8)]
[im 1/15]
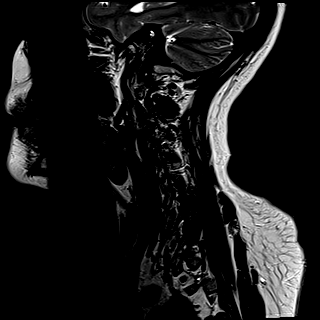

[Series 23: STIR · sagittal · 3.0mm · 0.86mm/px · 1 of 15 slices shown (1 of 3)]
[im 1/15]
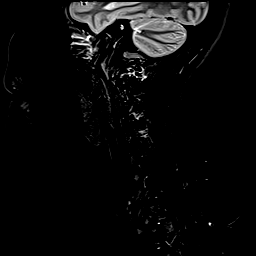

[Series 24: T2 · axial · 3.0mm · 0.70mm/px · 1 of 30 slices shown (4 of 8)]
[im 1/30]
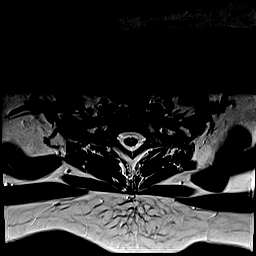

[Series 26: T1 · axial · 3.0mm · 0.35mm/px · 1 of 30 slices shown (4 of 9)]
[im 1/30]
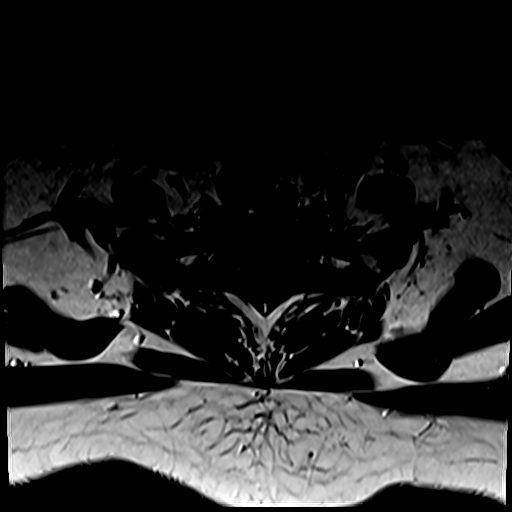

[Series 42: T1 · sagittal · 4.0mm · 1.72mm/px · 1 of 5 slices shown (5 of 9)]
[im 1/5]
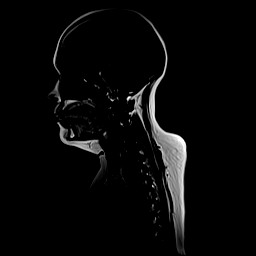

[Series 43: STIR · sagittal · 3.0mm · 1.03mm/px · 1 of 15 slices shown (2 of 3)]
[im 1/15]
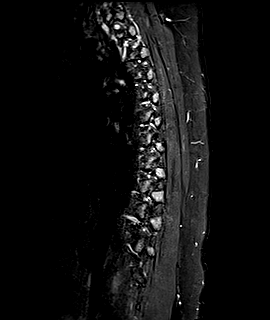

[Series 44: T1 · sagittal · 3.0mm · 1.00mm/px · 1 of 15 slices shown (6 of 9)]
[im 1/15]
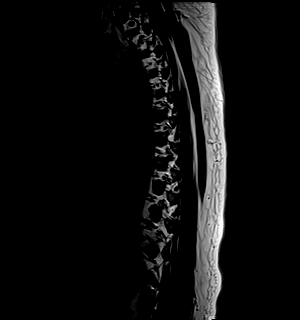

[Series 45: T2 · sagittal · 3.0mm · 0.86mm/px · 1 of 15 slices shown (5 of 8)]
[im 1/15]
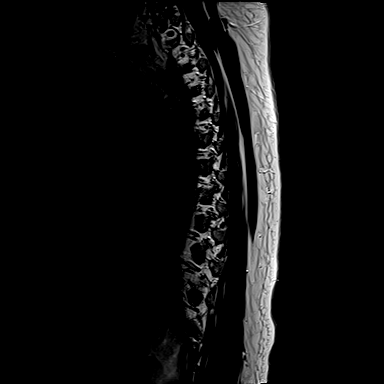

[Series 46: T2 · axial · 4.0mm · 0.78mm/px · 1 of 39 slices shown (6 of 8)]
[im 1/39]
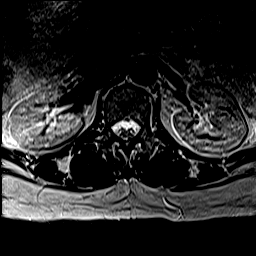

[Series 48: T1 · axial · 4.0mm · 0.39mm/px · 1 of 39 slices shown (7 of 9)]
[im 1/39]
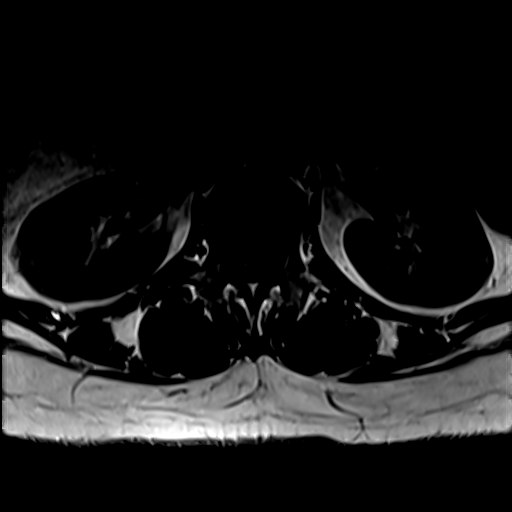

[Series 54: T2 · sagittal · 4.0mm · 0.81mm/px · 1 of 14 slices shown (7 of 8)]
[im 1/14]
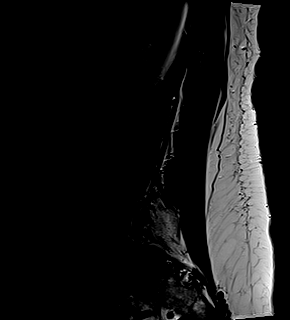

[Series 55: T1 · sagittal · 4.0mm · 0.81mm/px · 1 of 14 slices shown (8 of 9)]
[im 1/14]
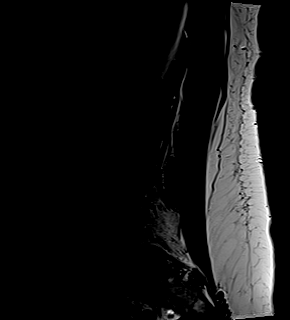

[Series 56: STIR · sagittal · 4.0mm · 0.51mm/px · 1 of 14 slices shown (3 of 3)]
[im 1/14]
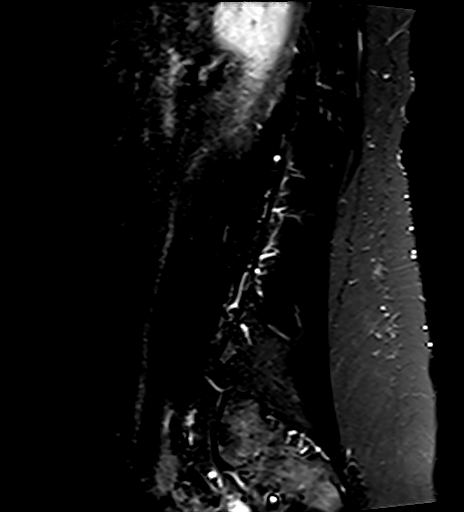

[Series 57: T2 · axial · 4.0mm · 0.62mm/px · 1 of 39 slices shown (8 of 8)]
[im 1/39]
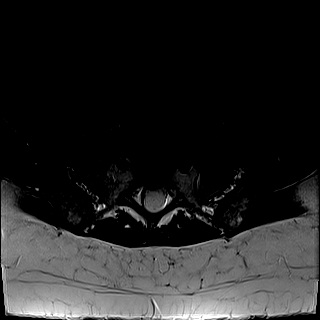

[Series 58: T1 · axial · 4.0mm · 0.39mm/px · 1 of 39 slices shown (9 of 9)]
[im 1/39]
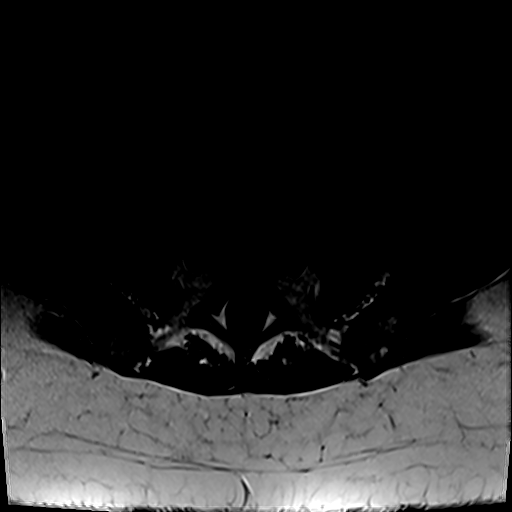

[Series 59: T1 fat-sat post-contrast · sagittal · 4.0mm · 0.81mm/px · 1 of 14 slices shown (1 of 3)]
[im 1/14]
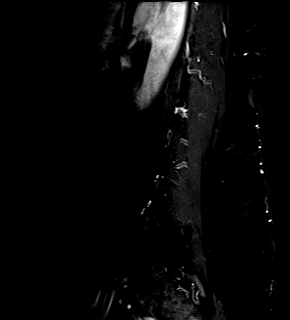

[Series 60: T1 post-contrast · axial · 4.0mm · 0.43mm/px · 1 of 39 slices shown (1 of 6)]
[im 1/39]
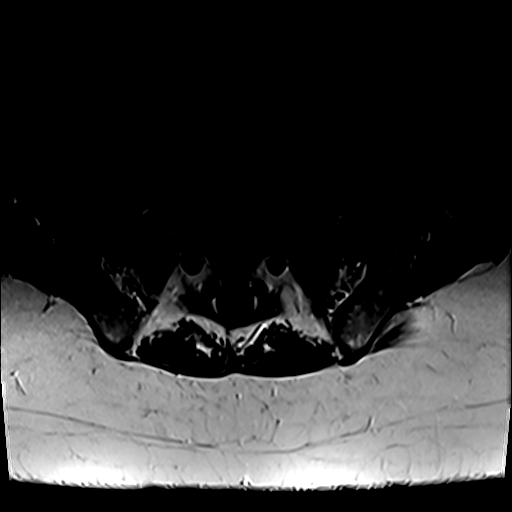

[Series 61: T1 fat-sat post-contrast · sagittal · 3.0mm · 1.03mm/px · 1 of 15 slices shown (2 of 3)]
[im 1/15]
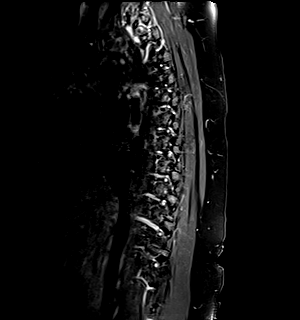

[Series 62: T1 post-contrast · axial · 4.0mm · 0.39mm/px · 1 of 39 slices shown (2 of 6)]
[im 1/39]
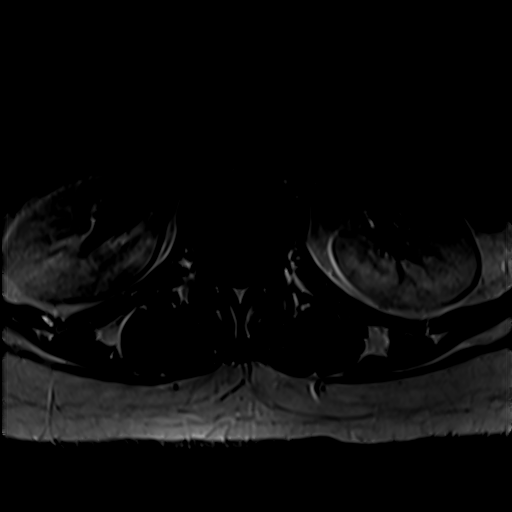

[Series 63: T1 fat-sat post-contrast · sagittal · 3.0mm · 0.69mm/px · 1 of 15 slices shown (3 of 3)]
[im 1/15]
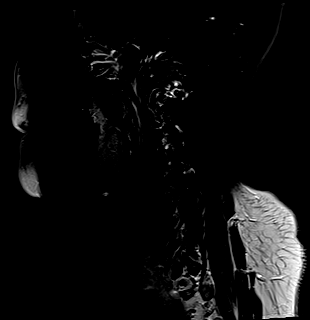

[Series 64: T1 post-contrast · axial · 3.0mm · 0.35mm/px · 1 of 30 slices shown (3 of 6)]
[im 1/30]
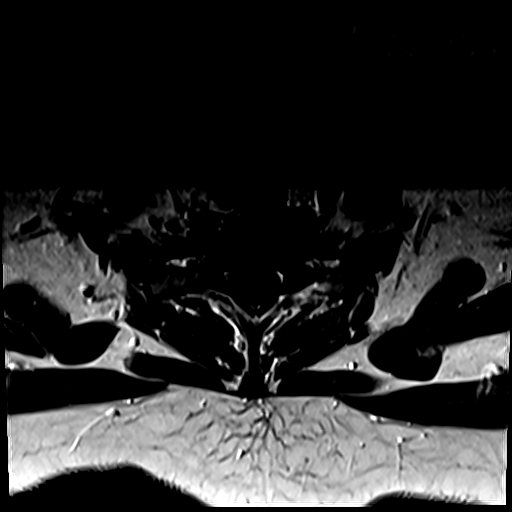

[Series 69: T1 post-contrast · axial · 1.0mm · 0.94mm/px · z∈[-105,+38]mm · 5 of 144 slices shown (4 of 6)]
[im 1/144]
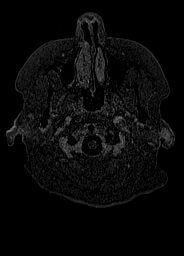
[im 36/144]
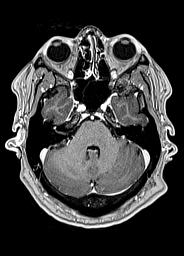
[im 72/144]
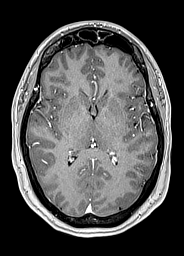
[im 108/144]
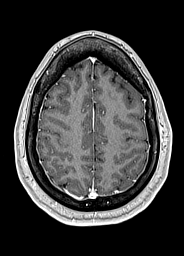
[im 144/144]
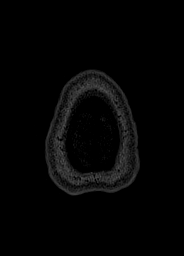

[Series 70: T1 post-contrast · coronal · 5.0mm · 0.43mm/px · 1 of 26 slices shown (5 of 6)]
[im 1/26]
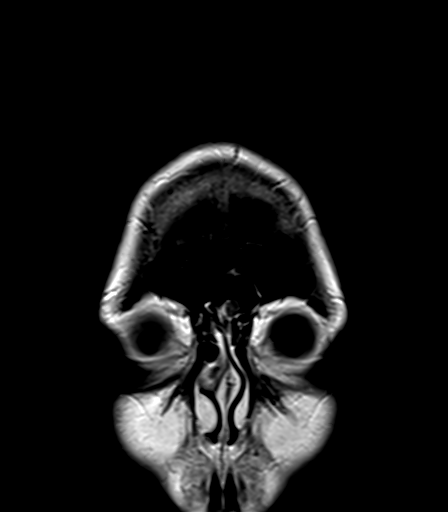

[Series 71: T1 post-contrast · sagittal · 5.0mm · 0.75mm/px · 1 of 24 slices shown (6 of 6)]
[im 1/24]
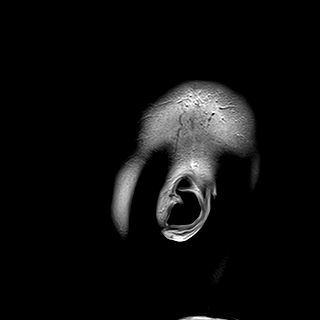

[44 of 48 positions shown; findings below may reference images not displayed]

FINDINGS: Alignment:  Normal

Vertebrae: Negative for fracture or mass. Few small hemangioma
noted.

Cord:  Normal signal morphology.  Normal enhancement of the cord.

Paraspinal and other soft tissues: Negative for paraspinous mass,
edema, or fluid.

Cluster of small cysts in the posterior right liver.

Disc levels:

No significant disc degeneration. Negative for thoracic spinal
stenosis.
IMPRESSION: Negative MRI thoracic spine.  Negative for demyelinating disease.

## 2021-05-08 MED ORDER — GADOBUTROL 1 MMOL/ML IV SOLN
9.0000 mL | Freq: Once | INTRAVENOUS | Status: AC | PRN
  Administered 2021-05-08: 9 mL via INTRAVENOUS

## 2021-05-08 NOTE — ED Provider Notes (Signed)
Pt signed out by Dr. Pilar Plate awaiting MRI results.  MRI results did not show an acute stroke or evidence of MS.  Pt is given a referral to Neuro.  Return if worse.    Jacalyn Lefevre, MD 05/08/21 1353

## 2021-05-08 NOTE — ED Notes (Signed)
Called MRI to get an ETA - states around 1015. Pt updated.

## 2021-05-08 NOTE — ED Provider Notes (Signed)
[facility]-EMERGENCY DEPT [facility] Emergency Department Provider Note MRN:  [id number]  Arrival date & time: 05/08/21     Chief Complaint   Numbness (Right leg pain/left flank pain)   History of Present Illness   Stephanie Rasmussen is a 40 y.o. year-old female with a history of A. fib presenting to the ED with chief complaint of numbness.  Patient is endorsing a pins-and-needles sensation to the right leg, woke her from sleep this evening.  Has been present for about 5 hours now.  Has a similar sensation in her right arm but is not as severe as her right leg.  This has happened 3 times over the past 2 months.  Denies any numbness or weakness in the arms or legs, no trouble with speech, no trouble swallowing, no trouble urinating, no issues with bowel movements.  No chest pain or shortness of breath, no abdominal pain, no visual disturbance.  Symptoms are constant, moderate, no exacerbating or alleviating factors.  Endorsing some left flank pain, has been recently treated for UTI.  Pain is worse with certain motions or positions.  Review of Systems  A complete 10 system review of systems was obtained and all systems are negative except as noted in the HPI and PMH.   Patient's Health History    Past Medical History:  Diagnosis Date   Atrial fibrillation [facility])     Past Surgical History:  Procedure Laterality Date   tubal ligation      No family history on file.  Social History   Socioeconomic History   Marital status: Single    Spouse name: Not on file   Number of children: Not on file   Years of education: Not on file   Highest education level: Not on file  Occupational History   Not on file  Tobacco Use   Smoking status: Never   Smokeless tobacco: Never  Substance and Sexual Activity   Alcohol use: Never   Drug use: Never   Sexual activity: Not on file  Other Topics Concern   Not on file  Social History Narrative   Not on file   Social Determinants of Health    Financial Resource Strain: Not on file  Food Insecurity: Not on file  Transportation Needs: Not on file  Physical Activity: Not on file  Stress: Not on file  Social Connections: Not on file  Intimate Partner Violence: Not on file     Physical Exam   Vitals:   05/08/21 0129  BP: 130/83  Pulse: 75  Resp: 17  Temp: 98.7 F (37.1 C)  SpO2: 98%    CONSTITUTIONAL: Well-appearing, NAD NEURO:  Alert and oriented x 3, no focal deficits EYES:  eyes equal and reactive ENT/NECK:  no LAD, no JVD CARDIO: Regular rate, well-perfused, normal S1 and S2 PULM:  CTAB no wheezing or rhonchi GI/GU:  normal bowel sounds, non-distended, non-tender MSK/SPINE:  No gross deformities, no edema SKIN:  no rash, atraumatic PSYCH:  Appropriate speech and behavior  *Additional and/or pertinent findings included in MDM below  Diagnostic and Interventional Summary    EKG Interpretation  Date/Time:    Ventricular Rate:    PR Interval:    QRS Duration:   QT Interval:    QTC Calculation:   R Axis:     Text Interpretation:          Labs Reviewed  URINALYSIS, ROUTINE W REFLEX MICROSCOPIC - Abnormal; Notable for the following components:      Result Value  Leukocytes,Ua SMALL (*)    Bacteria, UA RARE (*)    All other components within normal limits  CBC  BASIC METABOLIC PANEL  I-STAT BETA HCG BLOOD, ED ([facility], [facility], [facility] ONLY)    MR Brain W and Wo Contrast    (Results Pending)  MR CERVICAL SPINE W WO CONTRAST    (Results Pending)  MR THORACIC SPINE W WO CONTRAST    (Results Pending)  MR Lumbar Spine W Wo Contrast    (Results Pending)    Medications - No data to display   Procedures  /  Critical Care Procedures  ED Course and Medical Decision Making  I have reviewed the triage vital signs, the nursing notes, and pertinent available records from the EMR.  Listed above are laboratory and imaging tests that I personally ordered, reviewed, and interpreted and then considered in my medical  decision making (see below for details).  Differential diagnosis includes demyelinating disease such as MS, herniated disc with nerve root impingement.  Stroke is also considered given patient's history of A. fib but felt to be less likely.  Awaiting MRI imaging.  Signed out to oncoming provider at shift change.       Elmer Sow. Pilar Plate, MD [facility] Emergency Medicine [facility] [email or web address]  Final Clinical Impressions(s) / ED Diagnoses     ICD-10-CM   1. Paresthesia  R20.2       ED Discharge Orders     None        Discharge Instructions Discussed with and Provided to Patient:   Discharge Instructions   None       Sabas Sous, MD 05/08/21 [phone]

## 2021-05-08 NOTE — ED Notes (Signed)
Pt back from MRI 

## 2021-05-08 NOTE — ED Notes (Signed)
Pt still in MRI 

## 2021-05-08 NOTE — ED Triage Notes (Addendum)
Pt reports right leg numbness that feels like pins and needles and wont go away that started tonight.  Pt also reports left flank pain and urinary frequency.  Pt ambulatory with steady gait to restroom.

## 2021-05-08 NOTE — ED Notes (Signed)
Pt walked to restroom by herself.

## 2021-07-11 ENCOUNTER — Ambulatory Visit: Payer: Medicaid Other | Admitting: Neurology

## 2021-12-26 DIAGNOSIS — E7800 Pure hypercholesterolemia, unspecified: Secondary | ICD-10-CM | POA: Insufficient documentation

## 2021-12-26 DIAGNOSIS — D61818 Other pancytopenia: Secondary | ICD-10-CM | POA: Insufficient documentation

## 2021-12-26 DIAGNOSIS — R7303 Prediabetes: Secondary | ICD-10-CM | POA: Insufficient documentation

## 2021-12-26 DIAGNOSIS — E559 Vitamin D deficiency, unspecified: Secondary | ICD-10-CM | POA: Insufficient documentation

## 2021-12-31 ENCOUNTER — Emergency Department (HOSPITAL_COMMUNITY)
Admission: EM | Admit: 2021-12-31 | Discharge: 2022-01-01 | Disposition: A | Discharge status: Home / Self Care | Payer: Medicaid Other | Attending: Emergency Medicine | Admitting: Emergency Medicine

## 2021-12-31 ENCOUNTER — Other Ambulatory Visit: Payer: Self-pay

## 2021-12-31 DIAGNOSIS — M79604 Pain in right leg: Secondary | ICD-10-CM | POA: Diagnosis present

## 2021-12-31 LAB — BASIC METABOLIC PANEL
Anion gap: 4 — ABNORMAL LOW (ref 5–15)
BUN: 7 mg/dL (ref 6–20)
CO2: 25 mmol/L (ref 22–32)
Calcium: 8.7 mg/dL — ABNORMAL LOW (ref 8.9–10.3)
Chloride: 106 mmol/L (ref 98–111)
Creatinine, Ser: 0.59 mg/dL (ref 0.44–1.00)
GFR, Estimated: >60 mL/min (ref >60)
Glucose, Bld: 97 mg/dL (ref 70–99)
Potassium: 3.6 mmol/L (ref 3.5–5.1)
Sodium: 135 mmol/L (ref 135–145)

## 2021-12-31 LAB — CBC
HCT: 31 % — ABNORMAL LOW (ref 36.0–46.0)
Hemoglobin: 9.6 g/dL — ABNORMAL LOW (ref 12.0–15.0)
MCH: 23.6 pg — ABNORMAL LOW (ref 26.0–34.0)
MCHC: 31 g/dL (ref 30.0–36.0)
MCV: 76.2 fL — ABNORMAL LOW (ref 80.0–100.0)
Platelets: 245 10*3/uL (ref 150–400)
RBC: 4.07 MIL/uL (ref 3.87–5.11)
RDW: 15.7 % — ABNORMAL HIGH (ref 11.5–15.5)
WBC: 4.4 10*3/uL (ref 4.0–10.5)
nRBC: 0 % (ref 0.0–0.2)

## 2021-12-31 LAB — I-STAT BETA HCG BLOOD, ED (MC, WL, AP ONLY): I-stat hCG, quantitative: <5 m[IU]/mL (ref <5)

## 2021-12-31 NOTE — ED Triage Notes (Addendum)
Patient arrives from home with c/o right leg pain. States she took a 4 1/2 hour road trip and when she returned the pain started. Pt endorses pain over the right shin with mild swelling. Denies injury to area. Hx Afib

## 2021-12-31 NOTE — ED Notes (Signed)
Save blue tube in main lab °

## 2022-01-01 ENCOUNTER — Encounter (HOSPITAL_COMMUNITY): Payer: Self-pay | Admitting: Emergency Medicine

## 2022-01-01 NOTE — ED Provider Notes (Signed)
[facility]-EMERGENCY DEPT [facility] Emergency Department Provider Note MRN:  [id number]  Arrival date & time: 01/01/22     Chief Complaint   Leg Pain   History of Present Illness   Stephanie Rasmussen is a 41 y.o. year-old female with a history of A-fib presenting to the ED with chief complaint of leg pain.  Pain to the right anterior leg today.  Started after a car trip.  Concerned she has a DVT.  Denies chest pain or shortness of breath, no abdominal pain, no other complaints.  Review of Systems  A thorough review of systems was obtained and all systems are negative except as noted in the HPI and PMH.   Patient's Health History    Past Medical History:  Diagnosis Date   Atrial fibrillation [facility])     Past Surgical History:  Procedure Laterality Date   tubal ligation      History reviewed. No pertinent family history.  Social History   Socioeconomic History   Marital status: Single    Spouse name: Not on file   Number of children: Not on file   Years of education: Not on file   Highest education level: Not on file  Occupational History   Not on file  Tobacco Use   Smoking status: Never   Smokeless tobacco: Never  Vaping Use   Vaping Use: Never used  Substance and Sexual Activity   Alcohol use: Never   Drug use: Never   Sexual activity: Not on file  Other Topics Concern   Not on file  Social History Narrative   Not on file   Social Determinants of Health   Financial Resource Strain: Not on file  Food Insecurity: Not on file  Transportation Needs: Not on file  Physical Activity: Not on file  Stress: Not on file  Social Connections: Not on file  Intimate Partner Violence: Not on file     Physical Exam   Vitals:   01/01/22 0004 01/01/22 0015  BP: 135/89 135/89  Pulse: 100 91  Resp: 13 13  Temp:    SpO2: 98% 98%    CONSTITUTIONAL: Well-appearing, NAD NEURO/PSYCH:  Alert and oriented x 3, no focal deficits EYES:  eyes equal and reactive ENT/NECK:   no LAD, no JVD CARDIO: Regular rate, well-perfused, normal S1 and S2 PULM:  CTAB no wheezing or rhonchi GI/GU:  non-distended, non-tender MSK/SPINE:  No gross deformities, no edema, tenderness to the right tibialis anterior, no calf tenderness, no erythema, no increased warmth SKIN:  no rash, atraumatic   *Additional and/or pertinent findings included in MDM below  Diagnostic and Interventional Summary    EKG Interpretation  Date/Time:    Ventricular Rate:    PR Interval:    QRS Duration:   QT Interval:    QTC Calculation:   R Axis:     Text Interpretation:         Labs Reviewed  BASIC METABOLIC PANEL - Abnormal; Notable for the following components:      Result Value   Calcium 8.7 (*)    Anion gap 4 (*)    All other components within normal limits  CBC - Abnormal; Notable for the following components:   Hemoglobin 9.6 (*)    HCT 31.0 (*)    MCV 76.2 (*)    MCH 23.6 (*)    RDW 15.7 (*)    All other components within normal limits  I-STAT BETA HCG BLOOD, ED ([facility], [facility], [facility] ONLY)  No orders to display    Medications - No data to display   Procedures  /  Critical Care Ultrasound ED DVT  Date/Time: 01/01/2022 12:29 AM Performed by: Sabas Sous, MD Authorized by: Sabas Sous, MD   Procedure details:    Indications: lower extremity pain     Assessment for:  DVT   Images Archived: Yes     Limitations:  None RLE Findings:    Right common femoral vein:  Compressible   Right popliteal vein:  Compressible IMPRESSION:   DVT:     None  ED Course and Medical Decision Making  Initial Impression and Ddx Given the exam there is very little concern for DVT.  There is no edema or erythema, there is no tenderness with calf squeeze.  Seems like more of a tibialis anterior strain or spasm.  Bedside ultrasound is reassuring, see procedural details above.  Appropriate for discharge.  Past medical/surgical history that increases complexity of ED encounter:  None  Interpretation of Diagnostics Labs do not reveal any significant blood count or electrolyte disturbance.  Patient Reassessment and Ultimate Disposition/Management Discharge home  Patient management required discussion with the following services or consulting groups:  None  Complexity of Problems Addressed Acute illness or injury that poses threat of life of bodily function  Additional Data Reviewed and Analyzed Further history obtained from: None  Additional Factors Impacting ED Encounter Risk None  Elmer Sow. Pilar Plate, MD [facility] Emergency Medicine [facility] [email or web address]  Final Clinical Impressions(s) / ED Diagnoses     ICD-10-CM   1. Right leg pain  M79.604       ED Discharge Orders     None        Discharge Instructions Discussed with and Provided to Patient:    Discharge Instructions      You were evaluated in the Emergency Department and after careful evaluation, we did not find any emergent condition requiring admission or further testing in the hospital.  Your exam/testing today was overall reassuring.  Ultrasound did not show any signs of blood clot.  Suspect muscle strain or spasm, recommend Tylenol and/or Motrin every 4-6 hours for discomfort, rest.  Please return to the Emergency Department if you experience any worsening of your condition.  Thank you for allowing [place] to be a part of your care.       Sabas Sous, MD 01/01/22 [phone]

## 2022-01-01 NOTE — Discharge Instructions (Signed)
You were evaluated in the Emergency Department and after careful evaluation, we did not find any emergent condition requiring admission or further testing in the hospital.  Your exam/testing today was overall reassuring.  Ultrasound did not show any signs of blood clot.  Suspect muscle strain or spasm, recommend Tylenol and/or Motrin every 4-6 hours for discomfort, rest.  Please return to the Emergency Department if you experience any worsening of your condition.  Thank you for allowing [place] to be a part of your care.

## 2022-01-08 ENCOUNTER — Other Ambulatory Visit: Payer: Self-pay | Admitting: Nurse Practitioner

## 2022-01-08 DIAGNOSIS — Z1231 Encounter for screening mammogram for malignant neoplasm of breast: Secondary | ICD-10-CM

## 2022-08-05 ENCOUNTER — Ambulatory Visit (INDEPENDENT_AMBULATORY_CARE_PROVIDER_SITE_OTHER): Payer: Medicaid Other | Admitting: Obstetrics and Gynecology

## 2022-08-05 ENCOUNTER — Other Ambulatory Visit (HOSPITAL_COMMUNITY)
Admission: RE | Admit: 2022-08-05 | Discharge: 2022-08-05 | Disposition: A | Discharge status: Home / Self Care | Payer: Medicaid Other | Source: Ambulatory Visit | Attending: Obstetrics and Gynecology | Admitting: Obstetrics and Gynecology

## 2022-08-05 ENCOUNTER — Encounter: Payer: Self-pay | Admitting: Obstetrics and Gynecology

## 2022-08-05 DIAGNOSIS — Z01419 Encounter for gynecological examination (general) (routine) without abnormal findings: Secondary | ICD-10-CM | POA: Insufficient documentation

## 2022-08-05 NOTE — Patient Instructions (Signed)

## 2022-08-05 NOTE — Progress Notes (Signed)
Stephanie Rasmussen is a 41 y.o. [id number] female here for a routine annual gynecologic exam.  Current complaints: some external vaginal irritation.   Denies abnormal vaginal bleeding, discharge, pelvic pain, problems with intercourse or other gynecologic concerns.    Gynecologic History Patient's last menstrual period was 07/13/2022 (approximate). Contraception: abstinence Last Pap: uncertain.  Last mammogram: Declines  Obstetric History OB History  Gravida Para Term Preterm AB Living  5 4 4   1 4   SAB IAB Ectopic Multiple Live Births    1          # Outcome Date GA Lbr Len/2nd Weight Sex Delivery Anes PTL Lv  5 Term 05/23/14     Vag-Spont     4 Term 07/28/04     Vag-Spont     3 Term 01/21/02     Vag-Spont     2 Term 08/17/98     Vag-Spont     1 IAB             Past Medical History:  Diagnosis Date   Atrial fibrillation [facility])     Past Surgical History:  Procedure Laterality Date   tubal ligation  2015    No current outpatient medications on file prior to visit.   No current facility-administered medications on file prior to visit.    No Known Allergies  Social History   Socioeconomic History   Marital status: Single    Spouse name: Not on file   Number of children: Not on file   Years of education: Not on file   Highest education level: Not on file  Occupational History   Not on file  Tobacco Use   Smoking status: Never   Smokeless tobacco: Never  Vaping Use   Vaping Use: Never used  Substance and Sexual Activity   Alcohol use: Never   Drug use: Never   Sexual activity: Not Currently    Birth control/protection: Surgical  Other Topics Concern   Not on file  Social History Narrative   Not on file   Social Determinants of Health   Financial Resource Strain: Not on file  Food Insecurity: Not on file  Transportation Needs: Not on file  Physical Activity: Not on file  Stress: Not on file  Social Connections: Not on file  Intimate Partner Violence: Not  on file    Family History  Problem Relation Age of Onset   Hypertension Maternal Grandmother    Diabetes Maternal Grandmother    Hypertension Maternal Grandfather    Diabetes Maternal Grandfather     The following portions of the patient's history were reviewed and updated as appropriate: allergies, current medications, past family history, past medical history, past social history, past surgical history and problem list.  Review of Systems Pertinent items noted in HPI and remainder of comprehensive ROS otherwise negative.   Objective:  BP 118/72   Pulse 83   Ht 5' 7.5" (1.715 m)   Wt 219 lb (99.3 kg)   LMP 07/13/2022 (Approximate)   BMI 33.79 kg/m  Chaperone present CONSTITUTIONAL: Well-developed, well-nourished female in no acute distress.  HENT:  Normocephalic, atraumatic, External right and left ear normal. Oropharynx is clear and moist EYES: Conjunctivae and EOM are normal. Pupils are equal, round, and reactive to light. No scleral icterus.  NECK: Normal range of motion, supple, no masses.  Normal thyroid.  SKIN: Skin is warm and dry. No rash noted. Not diaphoretic. No erythema. No pallor. NEUROLGIC: Alert and oriented to person, place, and  time. Normal reflexes, muscle tone coordination. No cranial nerve deficit noted. PSYCHIATRIC: Normal mood and affect. Normal behavior. Normal judgment and thought content. CARDIOVASCULAR: Normal heart rate noted, regular rhythm RESPIRATORY: Clear to auscultation bilaterally. Effort and breath sounds normal, no problems with respiration noted. BREASTS: Symmetric in size. No masses, skin changes, nipple drainage, or lymphadenopathy. ABDOMEN: Soft, normal bowel sounds, no distention noted.  No tenderness, rebound or guarding.  PELVIC: Normal appearing external genitalia; normal appearing vaginal mucosa and cervix.  No abnormal discharge noted.  Pap smear obtained.  Normal uterine size, no other palpable masses, no uterine or adnexal  tenderness. MUSCULOSKELETAL: Normal range of motion. No tenderness.  No cyanosis, clubbing, or edema.  2+ distal pulses.   Assessment:  Annual gynecologic examination with pap smear   Plan:  Will follow up results of pap smear and manage accordingly. Mammogram , declined Routine preventative health maintenance measures emphasized. Please refer to After Visit Summary for other counseling recommendations.    Chancy Milroy, MD, [place] Attending [place] for [facility], [place]

## 2022-08-05 NOTE — Progress Notes (Signed)
Pt is new to office, establish GYN care. Pt complains of vaginal irritation, request BV/yeast testing.

## 2022-08-06 LAB — CERVICOVAGINAL ANCILLARY ONLY
Bacterial Vaginitis (gardnerella): NEGATIVE
Candida Glabrata: NEGATIVE
Candida Vaginitis: NEGATIVE
Comment: NEGATIVE
Comment: NEGATIVE
Comment: NEGATIVE

## 2022-08-11 LAB — CYTOLOGY - PAP
Comment: NEGATIVE
Diagnosis: NEGATIVE
Diagnosis: REACTIVE
High risk HPV: NEGATIVE

## 2023-04-02 ENCOUNTER — Other Ambulatory Visit (HOSPITAL_COMMUNITY)
Admission: RE | Admit: 2023-04-02 | Discharge: 2023-04-02 | Disposition: A | Discharge status: Home / Self Care | Payer: Medicaid Other | Source: Ambulatory Visit | Attending: Obstetrics and Gynecology | Admitting: Obstetrics and Gynecology

## 2023-04-02 ENCOUNTER — Ambulatory Visit (INDEPENDENT_AMBULATORY_CARE_PROVIDER_SITE_OTHER): Payer: Medicaid Other

## 2023-04-02 VITALS — BP 116/76 | HR 78 | Wt 220.8 lb

## 2023-04-02 DIAGNOSIS — N898 Other specified noninflammatory disorders of vagina: Secondary | ICD-10-CM | POA: Insufficient documentation

## 2023-04-02 NOTE — Progress Notes (Signed)
SUBJECTIVE:  42 y.o. female complains of vaginal irration with odor x 3 months. Reports painful cycles. Denies history of known exposure to STD.  LMP 03/11/23  OBJECTIVE:  She appears alert, well appearing, in no apparent distress Urine dipstick: not done.  ASSESSMENT:  Vaginal Discharge  Vaginal Odor    PLAN:  GC, chlamydia, trichomonas, BVAG, CVAG probe and urine culture sent to lab. Treatment: To be determined once lab results are received Schedule appt with provider to discuss painful cycles

## 2023-04-06 LAB — CERVICOVAGINAL ANCILLARY ONLY
Bacterial Vaginitis (gardnerella): NEGATIVE
Candida Glabrata: NEGATIVE
Candida Vaginitis: NEGATIVE
Chlamydia: NEGATIVE
Comment: NEGATIVE
Comment: NEGATIVE
Comment: NEGATIVE
Comment: NEGATIVE
Comment: NEGATIVE
Comment: NORMAL
Neisseria Gonorrhea: NEGATIVE
Trichomonas: NEGATIVE

## 2023-04-19 ENCOUNTER — Ambulatory Visit: Payer: Medicaid Other | Admitting: Obstetrics

## 2024-01-08 ENCOUNTER — Other Ambulatory Visit: Payer: Self-pay

## 2024-01-08 ENCOUNTER — Emergency Department (HOSPITAL_COMMUNITY)

## 2024-01-08 ENCOUNTER — Emergency Department (HOSPITAL_COMMUNITY)
Admission: EM | Admit: 2024-01-08 | Discharge: 2024-01-08 | Disposition: A | Discharge status: Home / Self Care | Attending: Emergency Medicine | Admitting: Emergency Medicine

## 2024-01-08 DIAGNOSIS — S8002XA Contusion of left knee, initial encounter: Secondary | ICD-10-CM | POA: Insufficient documentation

## 2024-01-08 DIAGNOSIS — S8001XA Contusion of right knee, initial encounter: Secondary | ICD-10-CM | POA: Insufficient documentation

## 2024-01-08 DIAGNOSIS — M25551 Pain in right hip: Secondary | ICD-10-CM | POA: Insufficient documentation

## 2024-01-08 DIAGNOSIS — R079 Chest pain, unspecified: Secondary | ICD-10-CM | POA: Insufficient documentation

## 2024-01-08 DIAGNOSIS — Y92410 Unspecified street and highway as the place of occurrence of the external cause: Secondary | ICD-10-CM | POA: Diagnosis not present

## 2024-01-08 DIAGNOSIS — S40012A Contusion of left shoulder, initial encounter: Secondary | ICD-10-CM | POA: Insufficient documentation

## 2024-01-08 DIAGNOSIS — S8991XA Unspecified injury of right lower leg, initial encounter: Secondary | ICD-10-CM | POA: Diagnosis present

## 2024-01-08 MED ORDER — ACETAMINOPHEN 500 MG PO TABS
1000.0000 mg | ORAL_TABLET | Freq: Once | ORAL | Status: AC
  Administered 2024-01-08: 1000 mg via ORAL
  Filled 2024-01-08: qty 2

## 2024-01-08 MED ORDER — IBUPROFEN 800 MG PO TABS
800.0000 mg | ORAL_TABLET | Freq: Once | ORAL | Status: AC
  Administered 2024-01-08: 800 mg via ORAL
  Filled 2024-01-08: qty 1

## 2024-01-08 NOTE — ED Notes (Signed)
 Pt to xray
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
## 2024-05-15 ENCOUNTER — Encounter: Payer: Self-pay | Admitting: Emergency Medicine

## 2024-05-15 ENCOUNTER — Ambulatory Visit
Admission: EM | Admit: 2024-05-15 | Discharge: 2024-05-15 | Disposition: A | Discharge status: Home / Self Care | Attending: Nurse Practitioner | Admitting: Nurse Practitioner

## 2024-05-15 DIAGNOSIS — H8110 Benign paroxysmal vertigo, unspecified ear: Secondary | ICD-10-CM | POA: Diagnosis not present

## 2024-05-15 LAB — POCT URINALYSIS DIP (MANUAL ENTRY)
Bilirubin, UA: NEGATIVE
Blood, UA: NEGATIVE
Glucose, UA: NEGATIVE mg/dL
Ketones, POC UA: NEGATIVE mg/dL
Leukocytes, UA: NEGATIVE
Nitrite, UA: NEGATIVE
Spec Grav, UA: >=1.03 — AB (ref 1.010–1.025)
Urobilinogen, UA: 0.2 U/dL
pH, UA: 6.5 (ref 5.0–8.0)

## 2024-05-15 LAB — POCT FASTING CBG KUC MANUAL ENTRY: POCT Glucose (KUC): 106 mg/dL — AB (ref 70–99)

## 2024-05-15 LAB — POCT URINE PREGNANCY: Preg Test, Ur: NEGATIVE

## 2024-05-15 MED ORDER — MECLIZINE HCL 25 MG PO TABS: 25.0000 mg | ORAL_TABLET | Freq: Three times a day (TID) | ORAL | 0 refills | Status: AC | PRN

## 2024-05-15 NOTE — ED Triage Notes (Signed)
 Pt presents c/o dizziness. Pt says Saturday she lounging around when the dizziness spell came out of nowhere. Pt says she feels like she is going to pass out when the spells hit her. The same thing occurred on Sunday when she was cleaning up. She states this is the first time anything like this has occurred.

## 2024-05-15 NOTE — Discharge Instructions (Addendum)
 You were seen today for dizziness that are consistent with benign paroxysmal positional vertigo (BPPV), a common condition that causes brief episodes of dizziness due to changes in head position. You were prescribed meclizine  to use as needed to help relieve dizziness. Take this medication as directed and avoid activities that could be unsafe while feeling dizzy, such as driving or climbing stairs. Drink plenty of fluids to stay hydrated and try to rest with your head elevated. Move slowly when changing positions, such as when getting out of bed or bending over.  This condition often resolves on its own over time, but if symptoms persist or interfere with your daily activities, follow up with your primary care provider.   Go to the emergency room if you experience severe headache, difficulty speaking, weakness or numbness in your arms or legs, vision or hearing changes, loss of coordination, or severe neck stiffness. These symptoms could indicate a more serious condition and should be evaluated immediately.

## 2024-05-15 NOTE — ED Provider Notes (Signed)
 [facility]    CSN: [id number] Arrival date & time: 05/15/24  1249      History   Chief Complaint Chief Complaint  Patient presents with   Near Syncope    HPI Stephanie Rasmussen is a 43 y.o. female.   Discussed the use of AI scribe software for clinical note transcription with the patient, who gave verbal consent to proceed.   Patient presents with dizziness that started on Saturday. She describes feeling like she is about to pass out, with symptoms worsening with position changes. This is a new issue for her, having never experienced similar episodes before. The patient reports that the dizziness has been occurring since Saturday, with another episode yesterday while cleaning her house. She denies any associated symptoms such as nausea, vomiting, neck pain, headaches, numbness, tingling, or vision issues. She denies recent illness, cough, congestion, cold, shortness of breath, or significant chest pain.  Patient has a history of long-standing atrial fibrillation, diagnosed years ago. She reports occasional heart flutters occurring approximately every 4-6 months, but states this did not occur over the weekend during her dizzy spells. The patient denies smoking, vaping, alcohol use, drug use, or recent travel. She does not take any daily medications. She also mentions that her ankles and feet are chronically swollen, but this is not a new development. She started increasing her fluid intake after the first episode of dizziness.  The following portions of the patient's history were reviewed and updated as appropriate: allergies, current medications, past family history, past medical history, past social history, past surgical history, and problem list.      Past Medical History:  Diagnosis Date   Atrial fibrillation [facility])     Patient Active Problem List   Diagnosis Date Noted   Visit for routine gyn exam 08/05/2022   Elevated LDL cholesterol level 12/26/2021    Pancytopenia (HCC) 12/26/2021   Prediabetes 12/26/2021   Vitamin D deficiency 12/26/2021   Furuncle of left axilla 03/17/2021   Postpartum care following vaginal delivery 05/23/2014   Atrial fibrillation (HCC) 02/06/2013    Past Surgical History:  Procedure Laterality Date   tubal ligation  2015    OB History     Gravida  5   Para  4   Term  4   Preterm      AB  1   Living  4      SAB      IAB  1   Ectopic      Multiple      Live Births               Home Medications    Prior to Admission medications   Medication Sig Start Date End Date Taking? Authorizing Provider  meclizine  (ANTIVERT ) 25 MG tablet Take 1 tablet (25 mg total) by mouth 3 (three) times daily as needed for dizziness. 05/15/24  Yes Iola Lukes, FNP    Family History Family History  Problem Relation Age of Onset   Hypertension Maternal Grandmother    Diabetes Maternal Grandmother    Hypertension Maternal Grandfather    Diabetes Maternal Grandfather     Social History Social History   Tobacco Use   Smoking status: Never    Passive exposure: Never   Smokeless tobacco: Never  Vaping Use   Vaping status: Never Used  Substance Use Topics   Alcohol use: Never   Drug use: Never     Allergies   Patient has no known allergies.  Review of Systems Review of Systems  Constitutional:  Negative for fatigue and fever.  HENT: Negative.    Eyes:  Negative for photophobia and visual disturbance.  Respiratory:  Negative for shortness of breath.   Cardiovascular:  Positive for palpitations (chronic fluttering in the chest; occurs very rarely; no episodes of this since current sx onset) and leg swelling (chronic bilateral leg & ankle swelling; no change from usual). Negative for chest pain.  Gastrointestinal:  Negative for nausea and vomiting.  Musculoskeletal:  Negative for neck pain.  Neurological:  Positive for dizziness. Negative for numbness and headaches.  All other systems  reviewed and are negative.    Physical Exam Triage Vital Signs ED Triage Vitals  Encounter Vitals Group     BP 05/15/24 1350 128/81     Girls Systolic BP Percentile --      Girls Diastolic BP Percentile --      Boys Systolic BP Percentile --      Boys Diastolic BP Percentile --      Pulse Rate 05/15/24 1350 74     Resp 05/15/24 1350 18     Temp 05/15/24 1350 97.8 F (36.6 C)     Temp Source 05/15/24 1350 Oral     SpO2 05/15/24 1350 98 %     Weight 05/15/24 1350 210 lb 1.6 oz (95.3 kg)     Height --      Head Circumference --      Peak Flow --      Pain Score 05/15/24 1349 0     Pain Loc --      Pain Education --      Exclude from Growth Chart --    Orthostatic VS for the past 24 hrs:  BP- Lying Pulse- Lying BP- Sitting Pulse- Sitting BP- Standing at 0 minutes Pulse- Standing at 0 minutes  05/15/24 1512 123/79 78 124/85 78 120/82 79    Updated Vital Signs BP 128/81 (BP Location: Left Arm)   Pulse 74   Temp 97.8 F (36.6 C) (Oral)   Resp 18   Wt 210 lb 1.6 oz (95.3 kg)   LMP 04/30/2024 (Approximate)   SpO2 98%   BMI 32.91 kg/m   Visual Acuity Right Eye Distance:   Left Eye Distance:   Bilateral Distance:    Right Eye Near:   Left Eye Near:    Bilateral Near:     Physical Exam Vitals reviewed.  Constitutional:      General: She is awake. She is not in acute distress.    Appearance: Normal appearance. She is well-developed. She is obese. She is not ill-appearing, toxic-appearing or diaphoretic.  HENT:     Head: Normocephalic.     Right Ear: Hearing, tympanic membrane, ear canal and external ear normal.     Left Ear: Hearing, tympanic membrane, ear canal and external ear normal.     Nose: Nose normal.     Mouth/Throat:     Mouth: Mucous membranes are moist.     Pharynx: Oropharynx is clear.  Eyes:     General: Vision grossly intact.     Extraocular Movements: Extraocular movements intact.     Right eye: Normal extraocular motion and no nystagmus.      Left eye: Normal extraocular motion and no nystagmus.     Conjunctiva/sclera: Conjunctivae normal.  Neck:     Trachea: Trachea and phonation normal.  Cardiovascular:     Rate and Rhythm: Normal rate and regular rhythm.  Heart sounds: Normal heart sounds.  Pulmonary:     Effort: Pulmonary effort is normal.     Breath sounds: Normal breath sounds and air entry.  Abdominal:     Palpations: Abdomen is soft.  Musculoskeletal:        General: Normal range of motion.     Cervical back: Normal range of motion and neck supple. No rigidity or tenderness.     Right lower leg: No edema.     Left lower leg: No edema.  Lymphadenopathy:     Cervical: No cervical adenopathy.  Skin:    General: Skin is warm and dry.  Neurological:     General: No focal deficit present.     Mental Status: She is alert and oriented to person, place, and time.     Cranial Nerves: No cranial nerve deficit.     Sensory: Sensation is intact. No sensory deficit.     Motor: Motor function is intact. No weakness.     Coordination: Coordination is intact.     Gait: Gait is intact.  Psychiatric:        Speech: Speech normal.        Behavior: Behavior is cooperative.      UC Treatments / Results  Labs (all labs ordered are listed, but only abnormal results are displayed) Labs Reviewed  POCT FASTING CBG [facility] MANUAL ENTRY - Abnormal; Notable for the following components:      Result Value   POCT Glucose ([facility]) 106 (*)    All other components within normal limits  POCT URINALYSIS DIP (MANUAL ENTRY) - Abnormal; Notable for the following components:   Clarity, UA hazy (*)    Spec Grav, UA >=1.030 (*)    Protein Ur, POC trace (*)    All other components within normal limits  POCT URINE PREGNANCY - Normal    EKG   Radiology No results found.  Procedures Procedures (including critical care time)  Medications Ordered in UC Medications - No data to display  Initial Impression / Assessment and Plan / UC  Course  I have reviewed the triage vital signs and the nursing notes.  Pertinent labs & imaging results that were available during my care of the patient were reviewed by me and considered in my medical decision making (see chart for details).     Patient presents with dizziness since Saturday, primarily triggered by positional changes. She denies nausea, vomiting, headache, numbness, tingling, vision changes, or recent illness. She has a known history of atrial fibrillation but reports no recent episodes. On examination, there is no evidence of nystagmus or other focal neurologic findings. The symptom pattern and absence of concerning features are consistent with benign paroxysmal positional vertigo (BPPV). Meclizine  was prescribed as needed for symptom relief, and the patient was advised to increase fluid intake to support hydration. Education was provided regarding BPPV, including its benign nature and likelihood of self-resolution. The patient was instructed to seek emergency care if she develops new or worsening symptoms, including severe headache, slurred speech, weakness or numbness in the extremities, vision or hearing changes, or significant neck stiffness. Follow-up with primary care is recommended if symptoms persist or worsen.  The following portions of the patient's history were reviewed and updated as appropriate: allergies, current medications, past family history, past medical history, past social history, past surgical history, and problem list. Final Clinical Impressions(s) / UC Diagnoses   Final diagnoses:  Benign paroxysmal positional vertigo, unspecified laterality     Discharge Instructions  You were seen today for dizziness that are consistent with benign paroxysmal positional vertigo (BPPV), a common condition that causes brief episodes of dizziness due to changes in head position. You were prescribed meclizine  to use as needed to help relieve dizziness. Take this  medication as directed and avoid activities that could be unsafe while feeling dizzy, such as driving or climbing stairs. Drink plenty of fluids to stay hydrated and try to rest with your head elevated. Move slowly when changing positions, such as when getting out of bed or bending over.  This condition often resolves on its own over time, but if symptoms persist or interfere with your daily activities, follow up with your primary care provider.   Go to the emergency room if you experience severe headache, difficulty speaking, weakness or numbness in your arms or legs, vision or hearing changes, loss of coordination, or severe neck stiffness. These symptoms could indicate a more serious condition and should be evaluated immediately.      ED Prescriptions     Medication Sig Dispense Auth. Provider   meclizine  (ANTIVERT ) 25 MG tablet Take 1 tablet (25 mg total) by mouth 3 (three) times daily as needed for dizziness. 12 tablet Iola Lukes, FNP      PDMP not reviewed this encounter.   Iola Lukes, [place] 05/15/24 1527
